# Patient Record
Sex: Female | Born: 1985 | Race: Black or African American | Hispanic: No | Marital: Single | Smoking: Current some day smoker
Health system: Southern US, Community
[De-identification: ages and names within clinical notes are randomized; demographics above are authoritative.]

## PROBLEM LIST (undated history)

## (undated) DIAGNOSIS — N939 Abnormal uterine and vaginal bleeding, unspecified: Secondary | ICD-10-CM

## (undated) DIAGNOSIS — E282 Polycystic ovarian syndrome: Secondary | ICD-10-CM

## (undated) DIAGNOSIS — D649 Anemia, unspecified: Secondary | ICD-10-CM

## (undated) DIAGNOSIS — IMO0002 Reserved for concepts with insufficient information to code with codable children: Secondary | ICD-10-CM

## (undated) DIAGNOSIS — A749 Chlamydial infection, unspecified: Secondary | ICD-10-CM

## (undated) DIAGNOSIS — R87629 Unspecified abnormal cytological findings in specimens from vagina: Secondary | ICD-10-CM

## (undated) DIAGNOSIS — I10 Essential (primary) hypertension: Secondary | ICD-10-CM

## (undated) HISTORY — DX: Anemia, unspecified: D64.9

## (undated) HISTORY — DX: Abnormal uterine and vaginal bleeding, unspecified: N93.9

## (undated) HISTORY — DX: Morbid (severe) obesity due to excess calories: E66.01

## (undated) HISTORY — DX: Polycystic ovarian syndrome: E28.2

## (undated) HISTORY — DX: Essential (primary) hypertension: I10

## (undated) HISTORY — DX: Reserved for concepts with insufficient information to code with codable children: IMO0002

## (undated) HISTORY — DX: Chlamydial infection, unspecified: A74.9

---
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
## 2007-03-03 ENCOUNTER — Emergency Department (HOSPITAL_COMMUNITY): Admission: EM | Admit: 2007-03-03 | Discharge: 2007-03-03 | Payer: Self-pay | Admitting: *Deleted

## 2008-01-16 ENCOUNTER — Emergency Department (HOSPITAL_COMMUNITY): Admission: EM | Admit: 2008-01-16 | Discharge: 2008-01-16 | Payer: Self-pay | Admitting: Emergency Medicine

## 2009-10-17 ENCOUNTER — Inpatient Hospital Stay (HOSPITAL_COMMUNITY): Admission: AD | Admit: 2009-10-17 | Discharge: 2009-10-17 | Payer: Self-pay | Admitting: Obstetrics & Gynecology

## 2009-12-29 ENCOUNTER — Inpatient Hospital Stay (HOSPITAL_COMMUNITY): Admission: AD | Admit: 2009-12-29 | Discharge: 2009-12-29 | Payer: Self-pay | Admitting: Family Medicine

## 2009-12-29 ENCOUNTER — Ambulatory Visit: Payer: Self-pay | Admitting: Gynecology

## 2010-03-07 ENCOUNTER — Inpatient Hospital Stay (HOSPITAL_COMMUNITY): Admission: AD | Admit: 2010-03-07 | Discharge: 2010-03-07 | Payer: Self-pay | Admitting: Obstetrics and Gynecology

## 2010-03-07 ENCOUNTER — Ambulatory Visit: Payer: Self-pay | Admitting: Family

## 2010-05-30 ENCOUNTER — Emergency Department (HOSPITAL_COMMUNITY)
Admission: EM | Admit: 2010-05-30 | Discharge: 2010-05-30 | Payer: Self-pay | Source: Home / Self Care | Admitting: Emergency Medicine

## 2010-06-22 ENCOUNTER — Ambulatory Visit: Payer: Self-pay | Admitting: Family Medicine

## 2010-06-22 DIAGNOSIS — IMO0002 Reserved for concepts with insufficient information to code with codable children: Secondary | ICD-10-CM

## 2010-06-22 DIAGNOSIS — R87619 Unspecified abnormal cytological findings in specimens from cervix uteri: Secondary | ICD-10-CM

## 2010-06-22 HISTORY — DX: Reserved for concepts with insufficient information to code with codable children: IMO0002

## 2010-06-22 HISTORY — DX: Unspecified abnormal cytological findings in specimens from cervix uteri: R87.619

## 2010-07-11 ENCOUNTER — Inpatient Hospital Stay (HOSPITAL_COMMUNITY)
Admission: AD | Admit: 2010-07-11 | Discharge: 2010-07-11 | Payer: Self-pay | Source: Home / Self Care | Attending: Family Medicine | Admitting: Family Medicine

## 2010-07-17 LAB — URINALYSIS, ROUTINE W REFLEX MICROSCOPIC
Bilirubin Urine: NEGATIVE
Ketones, ur: NEGATIVE mg/dL
Leukocytes, UA: NEGATIVE
Nitrite: NEGATIVE
Protein, ur: NEGATIVE mg/dL
Specific Gravity, Urine: 1.025 (ref 1.005–1.030)
Urine Glucose, Fasting: NEGATIVE mg/dL
Urobilinogen, UA: 0.2 mg/dL (ref 0.0–1.0)
pH: 6.5 (ref 5.0–8.0)

## 2010-07-17 LAB — POCT PREGNANCY, URINE: Preg Test, Ur: NEGATIVE

## 2010-07-17 LAB — URINE MICROSCOPIC-ADD ON

## 2010-07-19 ENCOUNTER — Inpatient Hospital Stay (HOSPITAL_COMMUNITY)
Admission: AD | Admit: 2010-07-19 | Discharge: 2010-07-19 | Payer: Self-pay | Source: Home / Self Care | Attending: Obstetrics and Gynecology | Admitting: Obstetrics and Gynecology

## 2010-07-24 LAB — CBC
HCT: 31.2 % — ABNORMAL LOW (ref 36.0–46.0)
Hemoglobin: 9.9 g/dL — ABNORMAL LOW (ref 12.0–15.0)
MCH: 21.9 pg — ABNORMAL LOW (ref 26.0–34.0)
MCHC: 31.7 g/dL (ref 30.0–36.0)
MCV: 69 fL — ABNORMAL LOW (ref 78.0–100.0)
Platelets: 420 10*3/uL — ABNORMAL HIGH (ref 150–400)
RBC: 4.52 MIL/uL (ref 3.87–5.11)
RDW: 16.9 % — ABNORMAL HIGH (ref 11.5–15.5)
WBC: 8.4 10*3/uL (ref 4.0–10.5)

## 2010-07-24 LAB — GC/CHLAMYDIA PROBE AMP, GENITAL
Chlamydia, DNA Probe: NEGATIVE
GC Probe Amp, Genital: NEGATIVE

## 2010-07-24 LAB — POCT PREGNANCY, URINE: Preg Test, Ur: NEGATIVE

## 2010-07-24 LAB — WET PREP, GENITAL
Trich, Wet Prep: NONE SEEN
Yeast Wet Prep HPF POC: NONE SEEN

## 2010-07-24 LAB — URINALYSIS, ROUTINE W REFLEX MICROSCOPIC
Bilirubin Urine: NEGATIVE
Ketones, ur: NEGATIVE mg/dL
Leukocytes, UA: NEGATIVE
Nitrite: NEGATIVE
Protein, ur: NEGATIVE mg/dL
Specific Gravity, Urine: 1.025 (ref 1.005–1.030)
Urine Glucose, Fasting: NEGATIVE mg/dL
Urobilinogen, UA: 0.2 mg/dL (ref 0.0–1.0)
pH: 6.5 (ref 5.0–8.0)

## 2010-07-24 LAB — URINE MICROSCOPIC-ADD ON

## 2010-07-26 ENCOUNTER — Ambulatory Visit
Admission: RE | Admit: 2010-07-26 | Discharge: 2010-07-26 | Payer: Self-pay | Source: Home / Self Care | Attending: Obstetrics and Gynecology | Admitting: Obstetrics and Gynecology

## 2010-07-26 LAB — POCT PREGNANCY, URINE: Preg Test, Ur: NEGATIVE

## 2010-07-28 ENCOUNTER — Ambulatory Visit
Admission: RE | Admit: 2010-07-28 | Discharge: 2010-07-28 | Payer: Self-pay | Source: Home / Self Care | Attending: Obstetrics & Gynecology | Admitting: Obstetrics & Gynecology

## 2010-07-28 ENCOUNTER — Other Ambulatory Visit: Payer: Self-pay | Admitting: Obstetrics and Gynecology

## 2010-07-28 ENCOUNTER — Other Ambulatory Visit
Admission: RE | Admit: 2010-07-28 | Discharge: 2010-07-28 | Payer: Self-pay | Source: Home / Self Care | Admitting: Obstetrics and Gynecology

## 2010-07-28 HISTORY — PX: COLPOSCOPY: SHX161

## 2010-07-28 LAB — POCT PREGNANCY, URINE: Preg Test, Ur: NEGATIVE

## 2010-08-10 ENCOUNTER — Ambulatory Visit (INDEPENDENT_AMBULATORY_CARE_PROVIDER_SITE_OTHER): Payer: Self-pay | Admitting: Physician Assistant

## 2010-08-10 DIAGNOSIS — R87610 Atypical squamous cells of undetermined significance on cytologic smear of cervix (ASC-US): Secondary | ICD-10-CM

## 2010-09-11 LAB — POCT PREGNANCY, URINE: Preg Test, Ur: NEGATIVE

## 2010-09-12 LAB — RAPID STREP SCREEN (MED CTR MEBANE ONLY): Streptococcus, Group A Screen (Direct): POSITIVE — AB

## 2010-09-14 LAB — URINALYSIS, ROUTINE W REFLEX MICROSCOPIC
Bilirubin Urine: NEGATIVE
Glucose, UA: NEGATIVE mg/dL
Ketones, ur: NEGATIVE mg/dL
Leukocytes, UA: NEGATIVE
Nitrite: NEGATIVE
Protein, ur: NEGATIVE mg/dL
Specific Gravity, Urine: 1.025 (ref 1.005–1.030)
Urobilinogen, UA: 1 mg/dL (ref 0.0–1.0)
pH: 6.5 (ref 5.0–8.0)

## 2010-09-14 LAB — CBC
HCT: 30.5 % — ABNORMAL LOW (ref 36.0–46.0)
Hemoglobin: 10.1 g/dL — ABNORMAL LOW (ref 12.0–15.0)
MCH: 24.9 pg — ABNORMAL LOW (ref 26.0–34.0)
MCHC: 33.1 g/dL (ref 30.0–36.0)
MCV: 75.2 fL — ABNORMAL LOW (ref 78.0–100.0)
Platelets: 318 10*3/uL (ref 150–400)
RBC: 4.06 MIL/uL (ref 3.87–5.11)
RDW: 16.3 % — ABNORMAL HIGH (ref 11.5–15.5)
WBC: 8.3 10*3/uL (ref 4.0–10.5)

## 2010-09-14 LAB — WET PREP, GENITAL
Clue Cells Wet Prep HPF POC: NONE SEEN
Yeast Wet Prep HPF POC: NONE SEEN

## 2010-09-14 LAB — POCT PREGNANCY, URINE: Preg Test, Ur: NEGATIVE

## 2010-09-14 LAB — GC/CHLAMYDIA PROBE AMP, GENITAL: GC Probe Amp, Genital: NEGATIVE

## 2010-09-14 LAB — URINE MICROSCOPIC-ADD ON

## 2010-09-17 LAB — URINALYSIS, ROUTINE W REFLEX MICROSCOPIC
Bilirubin Urine: NEGATIVE
Glucose, UA: NEGATIVE mg/dL
Ketones, ur: NEGATIVE mg/dL
Leukocytes, UA: NEGATIVE
Nitrite: NEGATIVE
Protein, ur: NEGATIVE mg/dL
Specific Gravity, Urine: >1.03 — ABNORMAL HIGH (ref 1.005–1.030)
Urobilinogen, UA: 0.2 mg/dL (ref 0.0–1.0)
pH: 6 (ref 5.0–8.0)

## 2010-09-17 LAB — CBC
HCT: 34.2 % — ABNORMAL LOW (ref 36.0–46.0)
Hemoglobin: 11.2 g/dL — ABNORMAL LOW (ref 12.0–15.0)
MCH: 25 pg — ABNORMAL LOW (ref 26.0–34.0)
MCHC: 32.9 g/dL (ref 30.0–36.0)
MCV: 75.9 fL — ABNORMAL LOW (ref 78.0–100.0)
Platelets: 294 10*3/uL (ref 150–400)
RBC: 4.5 MIL/uL (ref 3.87–5.11)
RDW: 15.8 % — ABNORMAL HIGH (ref 11.5–15.5)
WBC: 6.8 10*3/uL (ref 4.0–10.5)

## 2010-09-17 LAB — POCT PREGNANCY, URINE: Preg Test, Ur: NEGATIVE

## 2010-09-17 LAB — WET PREP, GENITAL: Clue Cells Wet Prep HPF POC: NONE SEEN

## 2010-09-17 LAB — GC/CHLAMYDIA PROBE AMP, GENITAL
Chlamydia, DNA Probe: NEGATIVE
GC Probe Amp, Genital: NEGATIVE

## 2010-09-19 LAB — WET PREP, GENITAL: Yeast Wet Prep HPF POC: NONE SEEN

## 2010-09-19 LAB — CBC
HCT: 34.7 % — ABNORMAL LOW (ref 36.0–46.0)
Platelets: 375 10*3/uL (ref 150–400)
RBC: 4.53 MIL/uL (ref 3.87–5.11)
WBC: 8.4 10*3/uL (ref 4.0–10.5)

## 2010-09-22 NOTE — Progress Notes (Signed)
NAME:  Carolyn Cortez, Carolyn Cortez NO.:  [id number]  MEDICAL RECORD NO.:  [id number]           PATIENT TYPE:  A  LOCATION:  [facility]                   FACILITY:  [facility]  PHYSICIAN:  Maylon Cos, CNM    DATE OF BIRTH:  [birth year]  DATE OF SERVICE:  08/10/2010                                 CLINIC NOTE  The patient is being seen in GYN Clinic at [facility].  Reason for today's visit is results of her colpo.  HISTORY OF PRESENT ILLNESS:  The patient is a 25 year old nulligravida patient who had an abnormal Pap smear in December showing ASCUS with positive HPV.  She underwent a colposcopy on July 28, 2010, presents today for the results.  The results were shared with the patient as follows:  Benign endocervical gland on the endocervix curettage.  The cervix biopsy shows a transitional zone mucosa with associated chronic inflammation, no dysplasia or malignancy.  These results were shared with the patient and her mother is also present during the visit.  The patient states she has chronic bacterial vaginosis and that could be a cause of her chronic inflammation seen on the cervical biopsy, and recommendation is to re-Pap q.6 month.  The patient verbalizes understanding and agreement with plan.  The patient also requests refill on her Sprintec oral contraceptive pills.  ASSESSMENT: 1. 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 2. Contraceptive management.  PLAN: 1. The patient is to return in 6 months for repeat Pap. 2. Sprintec prescriptions given with 12 refills.  The patient should     follow up as scheduled in 6 months or p.r.n. problems.          ______________________________ Maylon Cos, CNM    SS/MEDQ  D:  08/10/2010  T:  08/11/2010  Job:  [id number]

## 2010-10-21 ENCOUNTER — Inpatient Hospital Stay (HOSPITAL_COMMUNITY)
Admission: AD | Admit: 2010-10-21 | Discharge: 2010-10-22 | Discharge status: Against Medical Advice | Payer: Self-pay | Source: Ambulatory Visit | Attending: Obstetrics & Gynecology | Admitting: Obstetrics & Gynecology

## 2010-10-21 DIAGNOSIS — N912 Amenorrhea, unspecified: Secondary | ICD-10-CM | POA: Insufficient documentation

## 2010-10-21 DIAGNOSIS — Z3202 Encounter for pregnancy test, result negative: Secondary | ICD-10-CM | POA: Insufficient documentation

## 2010-12-28 ENCOUNTER — Ambulatory Visit: Payer: Self-pay | Admitting: Physician Assistant

## 2011-04-09 ENCOUNTER — Encounter: Payer: Self-pay | Admitting: *Deleted

## 2011-04-09 DIAGNOSIS — E282 Polycystic ovarian syndrome: Secondary | ICD-10-CM | POA: Insufficient documentation

## 2011-04-09 DIAGNOSIS — Z8619 Personal history of other infectious and parasitic diseases: Secondary | ICD-10-CM | POA: Insufficient documentation

## 2011-04-11 ENCOUNTER — Ambulatory Visit: Payer: Self-pay | Admitting: Obstetrics and Gynecology

## 2011-04-18 LAB — RAPID STREP SCREEN (MED CTR MEBANE ONLY): Streptococcus, Group A Screen (Direct): NEGATIVE

## 2011-04-27 ENCOUNTER — Encounter: Payer: Self-pay | Admitting: Obstetrics and Gynecology

## 2011-04-27 ENCOUNTER — Ambulatory Visit (INDEPENDENT_AMBULATORY_CARE_PROVIDER_SITE_OTHER): Payer: Self-pay | Admitting: Obstetrics and Gynecology

## 2011-04-27 VITALS — BP 117/77 | HR 84 | Temp 98.1°F | Ht 61.0 in | Wt 213.2 lb

## 2011-04-27 DIAGNOSIS — N949 Unspecified condition associated with female genital organs and menstrual cycle: Secondary | ICD-10-CM

## 2011-04-27 DIAGNOSIS — R87619 Unspecified abnormal cytological findings in specimens from cervix uteri: Secondary | ICD-10-CM

## 2011-04-27 DIAGNOSIS — N938 Other specified abnormal uterine and vaginal bleeding: Secondary | ICD-10-CM | POA: Insufficient documentation

## 2011-04-27 DIAGNOSIS — E282 Polycystic ovarian syndrome: Secondary | ICD-10-CM

## 2011-04-27 MED ORDER — METFORMIN HCL 500 MG PO TABS: 500.0000 mg | ORAL_TABLET | Freq: Two times a day (BID) | ORAL | Status: DC

## 2011-04-27 MED ORDER — NORGESTIMATE-ETH ESTRADIOL 0.25-35 MG-MCG PO TABS: 1.0000 | ORAL_TABLET | Freq: Every day | ORAL | Status: DC

## 2011-04-27 NOTE — Progress Notes (Signed)
Patient is a 25 year old African American female nulligravida with polycystic ovarian syndrome she was placed on oral contraceptives but 6 months ago which controlled her bleeding nicely however they had very expensive for her because of the brain and she was and she stopped them in June began bleeding in August and has been spotting or bleeding almost every day since. She also thought that caused her to put a lot of weight. We discussed with her polycystic ovarian syndrome and insulin insensitivity total weight gain was probably not from the pill but from and insulin insensitivity so we'll going to put her back on Sprintec and start metformin 500 twice a day I've told her 1 she's been on his metformin twice a day with meals for several months she might try coming off of the Sprintec if she desired and see whether or not this is truly the cause of her weight gain. She seems okay with this plan so we will proceed.  Repeat Pap smear was taken. Previous Pap smear with ascus plus high-risk HPV. Colposcopy was benign. This is her first Pap smear post a colposcopy.  Impression: Polycystic ovarian syndrome with dysfunctional uterine bleeding and weight gain.

## 2011-08-07 ENCOUNTER — Encounter (HOSPITAL_COMMUNITY): Payer: Self-pay | Admitting: *Deleted

## 2011-08-07 ENCOUNTER — Emergency Department (HOSPITAL_COMMUNITY): Payer: Self-pay

## 2011-08-07 ENCOUNTER — Emergency Department (HOSPITAL_COMMUNITY)
Admission: EM | Admit: 2011-08-07 | Discharge: 2011-08-07 | Disposition: A | Discharge status: Home / Self Care | Payer: Self-pay | Attending: Emergency Medicine | Admitting: Emergency Medicine

## 2011-08-07 DIAGNOSIS — R51 Headache: Secondary | ICD-10-CM | POA: Insufficient documentation

## 2011-08-07 DIAGNOSIS — R6884 Jaw pain: Secondary | ICD-10-CM | POA: Insufficient documentation

## 2011-08-07 DIAGNOSIS — S0083XA Contusion of other part of head, initial encounter: Secondary | ICD-10-CM

## 2011-08-07 DIAGNOSIS — R220 Localized swelling, mass and lump, head: Secondary | ICD-10-CM | POA: Insufficient documentation

## 2011-08-07 DIAGNOSIS — S0003XA Contusion of scalp, initial encounter: Secondary | ICD-10-CM | POA: Insufficient documentation

## 2011-08-07 DIAGNOSIS — R221 Localized swelling, mass and lump, neck: Secondary | ICD-10-CM | POA: Insufficient documentation

## 2011-08-07 MED ORDER — IBUPROFEN 800 MG PO TABS
800.0000 mg | ORAL_TABLET | Freq: Once | ORAL | Status: AC
  Administered 2011-08-07: 800 mg via ORAL
  Filled 2011-08-07: qty 1
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
## 2011-08-07 NOTE — ED Notes (Signed)
Pt is here with headache to left side and woke up with it.  No fever or vision change.  Pt sts she has a swollen knot to left side of head.

## 2011-08-07 NOTE — ED Provider Notes (Signed)
Medical screening examination/treatment/procedure(s) were performed by non-physician practitioner and as supervising physician I was immediately available for consultation/collaboration.   Forbes Cellar, MD 08/07/11 1158

## 2011-08-15 ENCOUNTER — Ambulatory Visit: Payer: Self-pay | Admitting: Physician Assistant

## 2011-09-13 ENCOUNTER — Ambulatory Visit: Payer: Self-pay | Admitting: Physician Assistant

## 2012-05-14 ENCOUNTER — Encounter (HOSPITAL_COMMUNITY): Payer: Self-pay | Admitting: *Deleted

## 2012-05-14 ENCOUNTER — Inpatient Hospital Stay (HOSPITAL_COMMUNITY)
Admission: AD | Admit: 2012-05-14 | Discharge: 2012-05-14 | Disposition: A | Discharge status: Home / Self Care | Payer: Self-pay | Source: Ambulatory Visit | Attending: Obstetrics & Gynecology | Admitting: Obstetrics & Gynecology

## 2012-05-14 DIAGNOSIS — N938 Other specified abnormal uterine and vaginal bleeding: Secondary | ICD-10-CM | POA: Insufficient documentation

## 2012-05-14 DIAGNOSIS — E282 Polycystic ovarian syndrome: Secondary | ICD-10-CM

## 2012-05-14 DIAGNOSIS — Z202 Contact with and (suspected) exposure to infections with a predominantly sexual mode of transmission: Secondary | ICD-10-CM | POA: Insufficient documentation

## 2012-05-14 DIAGNOSIS — N920 Excessive and frequent menstruation with regular cycle: Secondary | ICD-10-CM | POA: Insufficient documentation

## 2012-05-14 DIAGNOSIS — N949 Unspecified condition associated with female genital organs and menstrual cycle: Secondary | ICD-10-CM | POA: Insufficient documentation

## 2012-05-14 LAB — URINALYSIS, ROUTINE W REFLEX MICROSCOPIC
Ketones, ur: NEGATIVE mg/dL
Leukocytes, UA: NEGATIVE
Nitrite: NEGATIVE
Protein, ur: NEGATIVE mg/dL
pH: 7 (ref 5.0–8.0)

## 2012-05-14 LAB — WET PREP, GENITAL

## 2012-05-14 LAB — URINE MICROSCOPIC-ADD ON

## 2012-05-14 LAB — CBC
HCT: 31.5 % — ABNORMAL LOW (ref 36.0–46.0)
MCV: 67.3 fL — ABNORMAL LOW (ref 78.0–100.0)
RBC: 4.68 MIL/uL (ref 3.87–5.11)
WBC: 8.3 10*3/uL (ref 4.0–10.5)

## 2012-05-14 MED ORDER — METFORMIN HCL 500 MG PO TABS: 500.0000 mg | ORAL_TABLET | Freq: Two times a day (BID) | ORAL | Status: DC

## 2012-05-14 MED ORDER — NORGESTIMATE-ETH ESTRADIOL 0.25-35 MG-MCG PO TABS: 1.0000 | ORAL_TABLET | Freq: Every day | ORAL | Status: DC

## 2012-05-14 NOTE — MAU Provider Note (Signed)
History     CSN: [id number]  Arrival date and time: 05/14/12 4098   First Provider Initiated Contact with Patient 05/14/12 1949      Chief Complaint  Patient presents with  . Vaginal Bleeding   HPI This is a 26 y.o. female who presents with complaints of heavy vaginal bleeding. Started Sunday.  Some cramps. Was recently told by boyfriend that he has trich.   She was seen previously by Dr Okey Dupre who diagnosed her with PCOS and put her on OCPs and metformin. She since then has stopped both, thinking they caused her to gain weight.   RN Note: Heavy bleeding since Sunday LMP 10/30. No pain   OB History    Grav Para Term Preterm Abortions TAB SAB Ect Mult Living   0 0 0 0 0 0 0 0 0 0       Past Medical History  Diagnosis Date  . PCOS (polycystic ovarian syndrome)   . Abnormal Pap smear 06/22/2010  . Chlamydia   . Anemia   . Vaginal bleeding, abnormal     Past Surgical History  Procedure Date  . Colposcopy 07/28/2010    History reviewed. No pertinent family history.  History  Substance Use Topics  . Smoking status: Former Games developer  . Smokeless tobacco: Never Used  . Alcohol Use: 1.0 oz/week    2 drink(s) per week    Allergies: No Known Allergies  Prescriptions prior to admission  Medication Sig Dispense Refill  . Aspirin-Acetaminophen-Caffeine (EXCEDRIN PO) Take 1 tablet by mouth every 6 (six) hours as needed. For pain      . Ferrous Sulfate (SLOW FE PO) Take 1 tablet by mouth daily.       Marland Kitchen ibuprofen (ADVIL,MOTRIN) 200 MG tablet Take 400 mg by mouth every 6 (six) hours as needed. For pain        ROS See HPI  Physical Exam   Temperature 98.2 F (36.8 C), temperature source Oral, resp. rate 18, height 5\' 2"  (1.575 m), weight 204 lb (92.534 kg), last menstrual period 04/30/2012.  Physical Exam  Constitutional: She is oriented to person, place, and time. She appears well-developed and well-nourished. No distress.  Cardiovascular: Normal rate.   Respiratory:  Effort normal.  GI: Soft. She exhibits no distension and no mass. There is no tenderness. There is no rebound and no guarding.  Genitourinary: Uterus normal. Vaginal discharge (Moderate blood in vault.  Moderate CMT. Difficult to tell if it was true CMT or anxiety with exam) found.  Musculoskeletal: Normal range of motion.  Neurological: She is alert and oriented to person, place, and time.  Skin: Skin is warm and dry.  Psychiatric: She has a normal mood and affect.   Results for orders placed during the hospital encounter of 05/14/12 (from the past 24 hour(s))  URINALYSIS, ROUTINE W REFLEX MICROSCOPIC     Status: Abnormal   Collection Time   05/14/12  6:50 PM      Component Value Range   Color, Urine YELLOW  YELLOW   APPearance CLEAR  CLEAR   Specific Gravity, Urine 1.015  1.005 - 1.030   pH 7.0  5.0 - 8.0   Glucose, UA NEGATIVE  NEGATIVE mg/dL   Hgb urine dipstick LARGE (*) NEGATIVE   Bilirubin Urine NEGATIVE  NEGATIVE   Ketones, ur NEGATIVE  NEGATIVE mg/dL   Protein, ur NEGATIVE  NEGATIVE mg/dL   Urobilinogen, UA 1.0  0.0 - 1.0 mg/dL   Nitrite NEGATIVE  NEGATIVE   Leukocytes,  UA NEGATIVE  NEGATIVE  URINE MICROSCOPIC-ADD ON     Status: Abnormal   Collection Time   05/14/12  6:50 PM      Component Value Range   Squamous Epithelial / LPF FEW (*) RARE   WBC, UA 0-2  <3 WBC/hpf   RBC / HPF 21-50  <3 RBC/hpf   Bacteria, UA MANY (*) RARE   Urine-Other MUCOUS PRESENT    POCT PREGNANCY, URINE     Status: Normal   Collection Time   05/14/12  7:14 PM      Component Value Range   Preg Test, Ur NEGATIVE  NEGATIVE  WET PREP, GENITAL     Status: Abnormal   Collection Time   05/14/12  7:57 PM      Component Value Range   Yeast Wet Prep HPF POC NONE SEEN  NONE SEEN   Trich, Wet Prep NONE SEEN  NONE SEEN   Clue Cells Wet Prep HPF POC FEW (*) NONE SEEN   WBC, Wet Prep HPF POC FEW (*) NONE SEEN  CBC     Status: Abnormal   Collection Time   05/14/12  8:00 PM      Component Value Range    WBC 8.3  4.0 - 10.5 K/uL   RBC 4.68  3.87 - 5.11 MIL/uL   Hemoglobin 10.2 (*) 12.0 - 15.0 g/dL   HCT 16.1 (*) 09.6 - 04.5 %   MCV 67.3 (*) 78.0 - 100.0 fL   MCH 21.8 (*) 26.0 - 34.0 pg   MCHC 32.4  30.0 - 36.0 g/dL   RDW 40.9 (*) 81.1 - 91.4 %   Platelets 361  150 - 400 K/uL    MAU Course  Procedures  Assessment and Plan  A:  Menorrhagia/DUB      PCOS      Exposure to Trich, wet prep positive for clue  P:  Will have her resume OCPs and Metformin       Will Rx Flagyl for BV       Encouraged her to seek Family doctor to eval for diabetes  [facility] 05/14/2012, 8:41 PM

## 2012-05-14 NOTE — MAU Note (Signed)
Heavy bleeding since Sunday LMP 10/30. No pain

## 2012-05-15 LAB — GC/CHLAMYDIA PROBE AMP, GENITAL
Chlamydia, DNA Probe: NEGATIVE
GC Probe Amp, Genital: NEGATIVE

## 2012-05-21 ENCOUNTER — Telehealth: Payer: Self-pay

## 2012-05-21 NOTE — Telephone Encounter (Signed)
Pt called and stated I have a question but didn't say what they question was. Called pt and pt asked what to do cause she usually takes her BCP @ 630 am every day and today she forgot to take it @ 630 am.  I advised pt to go ahead and take the pill now and just continue with her normal routine.  Pt asked is that "ok".  I stated to pt that yes it's ok but to make sure that she continues to take BCP same time everyday and because she missed a time just take the pill as soon as you remember but to please not make a habit of doing so.  Pt stated understanding and no further questions.

## 2012-05-26 ENCOUNTER — Ambulatory Visit: Payer: Self-pay | Admitting: Obstetrics and Gynecology

## 2012-07-16 ENCOUNTER — Ambulatory Visit: Payer: Self-pay | Admitting: Family Medicine

## 2012-07-17 ENCOUNTER — Encounter (HOSPITAL_COMMUNITY): Payer: Self-pay | Admitting: Emergency Medicine

## 2012-07-17 ENCOUNTER — Other Ambulatory Visit: Payer: Self-pay

## 2012-07-17 ENCOUNTER — Emergency Department (HOSPITAL_COMMUNITY)
Admission: EM | Admit: 2012-07-17 | Discharge: 2012-07-17 | Disposition: A | Discharge status: Home / Self Care | Payer: Self-pay | Attending: Emergency Medicine | Admitting: Emergency Medicine

## 2012-07-17 ENCOUNTER — Emergency Department (HOSPITAL_COMMUNITY): Payer: Self-pay

## 2012-07-17 DIAGNOSIS — Z862 Personal history of diseases of the blood and blood-forming organs and certain disorders involving the immune mechanism: Secondary | ICD-10-CM | POA: Insufficient documentation

## 2012-07-17 DIAGNOSIS — R51 Headache: Secondary | ICD-10-CM | POA: Insufficient documentation

## 2012-07-17 DIAGNOSIS — R1110 Vomiting, unspecified: Secondary | ICD-10-CM | POA: Insufficient documentation

## 2012-07-17 DIAGNOSIS — Z3202 Encounter for pregnancy test, result negative: Secondary | ICD-10-CM | POA: Insufficient documentation

## 2012-07-17 DIAGNOSIS — Z8619 Personal history of other infectious and parasitic diseases: Secondary | ICD-10-CM | POA: Insufficient documentation

## 2012-07-17 DIAGNOSIS — Z8742 Personal history of other diseases of the female genital tract: Secondary | ICD-10-CM | POA: Insufficient documentation

## 2012-07-17 DIAGNOSIS — Z87891 Personal history of nicotine dependence: Secondary | ICD-10-CM | POA: Insufficient documentation

## 2012-07-17 LAB — BASIC METABOLIC PANEL
BUN: 10 mg/dL (ref 6–23)
CO2: 24 mEq/L (ref 19–32)
Calcium: 9.1 mg/dL (ref 8.4–10.5)
Chloride: 109 mEq/L (ref 96–112)
Creatinine, Ser: 0.65 mg/dL (ref 0.50–1.10)
GFR calc Af Amer: >90 mL/min (ref >90)
GFR calc non Af Amer: >90 mL/min (ref >90)
Glucose, Bld: 90 mg/dL (ref 70–99)
Potassium: 4.4 mEq/L (ref 3.5–5.1)
Sodium: 143 mEq/L (ref 135–145)

## 2012-07-17 LAB — CBC WITH DIFFERENTIAL/PLATELET
Basophils Absolute: 0 10*3/uL (ref 0.0–0.1)
Basophils Relative: 0 % (ref 0–1)
Eosinophils Absolute: 0.1 10*3/uL (ref 0.0–0.7)
Eosinophils Relative: 1 % (ref 0–5)
HCT: 32.8 % — ABNORMAL LOW (ref 36.0–46.0)
Hemoglobin: 10.9 g/dL — ABNORMAL LOW (ref 12.0–15.0)
Lymphocytes Relative: 29 % (ref 12–46)
Lymphs Abs: 2 10*3/uL (ref 0.7–4.0)
MCH: 22.9 pg — ABNORMAL LOW (ref 26.0–34.0)
MCHC: 33.2 g/dL (ref 30.0–36.0)
MCV: 69.1 fL — ABNORMAL LOW (ref 78.0–100.0)
Monocytes Absolute: 0.7 10*3/uL (ref 0.1–1.0)
Monocytes Relative: 10 % (ref 3–12)
Neutro Abs: 4 10*3/uL (ref 1.7–7.7)
Neutrophils Relative %: 60 % (ref 43–77)
Platelets: 396 10*3/uL (ref 150–400)
RBC: 4.75 MIL/uL (ref 3.87–5.11)
RDW: 17.4 % — ABNORMAL HIGH (ref 11.5–15.5)
WBC: 6.8 10*3/uL (ref 4.0–10.5)

## 2012-07-17 LAB — URINALYSIS, ROUTINE W REFLEX MICROSCOPIC
Glucose, UA: NEGATIVE mg/dL
Ketones, ur: NEGATIVE mg/dL
Leukocytes, UA: NEGATIVE
pH: 6 (ref 5.0–8.0)

## 2012-07-17 LAB — POCT PREGNANCY, URINE: Preg Test, Ur: NEGATIVE

## 2012-07-17 MED ORDER — KETOROLAC TROMETHAMINE 30 MG/ML IJ SOLN
30.0000 mg | Freq: Once | INTRAMUSCULAR | Status: DC
  Filled 2012-07-17: qty 1

## 2012-07-17 MED ORDER — IBUPROFEN 800 MG PO TABS: 800.0000 mg | ORAL_TABLET | Freq: Three times a day (TID) | ORAL | Status: DC | PRN

## 2012-07-17 MED ORDER — PROMETHAZINE HCL 25 MG PO TABS: 25.0000 mg | ORAL_TABLET | Freq: Four times a day (QID) | ORAL | Status: DC | PRN

## 2012-07-17 MED ORDER — ONDANSETRON HCL 4 MG/2ML IJ SOLN
4.0000 mg | Freq: Once | INTRAMUSCULAR | Status: DC
  Filled 2012-07-17: qty 2

## 2012-07-17 MED ORDER — IBUPROFEN 800 MG PO TABS
800.0000 mg | ORAL_TABLET | Freq: Once | ORAL | Status: AC
  Administered 2012-07-17: 800 mg via ORAL
  Filled 2012-07-17: qty 1

## 2012-07-17 MED ORDER — SODIUM CHLORIDE 0.9 % IV BOLUS (SEPSIS): 1000.0000 mL | Freq: Once | INTRAVENOUS | Status: DC

## 2012-07-17 NOTE — ED Notes (Signed)
Rt sided cp that started today and had some sob x 2 months when she talks  Vomited x 3 this am lmp  End of dec

## 2012-07-17 NOTE — ED Notes (Signed)
Pt refused IV, IV fluids, and IV meds. Pt requests po meds.

## 2012-07-17 NOTE — ED Provider Notes (Signed)
Medical screening examination/treatment/procedure(s) were performed by non-physician practitioner and as supervising physician I was immediately available for consultation/collaboration.  Doug Sou, MD 07/17/12 [phone]

## 2012-07-17 NOTE — ED Provider Notes (Signed)
History     CSN: [id number]  Arrival date & time 07/17/12  6213   First MD Initiated Contact with Patient 07/17/12 0932      No chief complaint on file.   (Consider location/radiation/quality/duration/timing/severity/associated sxs/prior treatment) HPI Patient presents to the emergency department with vomiting and headache.  Patient states she had vomiting x5 this morning and then noted headache and right upper chest pain after this.  Patient denies abdominal pain, vaginal bleeding, vaginal discharge, blurred vision, syncope, dizziness, weakness, back pain, dysuria, or fever. The patient does not smoke. The patient states that palpation make the chest pain worse. The patient did not take anything prior to arrival. Past Medical History  Diagnosis Date  . PCOS (polycystic ovarian syndrome)   . Abnormal Pap smear 06/22/2010  . Chlamydia   . Anemia   . Vaginal bleeding, abnormal     Past Surgical History  Procedure Date  . Colposcopy 07/28/2010    No family history on file.  History  Substance Use Topics  . Smoking status: Former Games developer  . Smokeless tobacco: Never Used  . Alcohol Use: 1.0 oz/week    2 drink(s) per week    OB History    Grav Para Term Preterm Abortions TAB SAB Ect Mult Living   0 0 0 0 0 0 0 0 0 0       Review of Systems All other systems negative except as documented in the HPI. All pertinent positives and negatives as reviewed in the HPI.  Allergies  Review of patient's allergies indicates no known allergies.  Home Medications   Current Outpatient Rx  Name  Route  Sig  Dispense  Refill  . SLOW FE PO   Oral   Take 1 tablet by mouth daily.          Marland Kitchen METFORMIN HCL 500 MG PO TABS   Oral   Take 1 tablet (500 mg total) by mouth 2 (two) times daily with a meal.   60 tablet   1   . NORGESTIMATE-ETH ESTRADIOL 0.25-35 MG-MCG PO TABS   Oral   Take 1 tablet by mouth daily.   1 Package   11     BP 135/80  Pulse 82  Temp 98.9 F (37.2 C)   Resp 16  SpO2 98%  LMP 07/02/2012  Physical Exam  Nursing note and vitals reviewed. Constitutional: She is oriented to person, place, and time. She appears well-developed and well-nourished.  HENT:  Head: Normocephalic and atraumatic.  Mouth/Throat: Oropharynx is clear and moist.  Eyes: Pupils are equal, round, and reactive to light.  Neck: Normal range of motion. Neck supple.  Cardiovascular: Normal rate, regular rhythm and normal heart sounds.  Exam reveals no gallop and no friction rub.   No murmur heard. Pulmonary/Chest: Effort normal and breath sounds normal. She exhibits tenderness.  Abdominal: Soft. Bowel sounds are normal.  Lymphadenopathy:    She has no cervical adenopathy.  Neurological: She is alert and oriented to person, place, and time. No cranial nerve deficit. She exhibits normal muscle tone. Coordination normal.  Skin: Skin is warm and dry. No rash noted.    ED Course  Procedures (including critical care time)  Labs Reviewed  URINALYSIS, ROUTINE W REFLEX MICROSCOPIC - Abnormal; Notable for the following:    Specific Gravity, Urine 1.033 (*)     All other components within normal limits  CBC WITH DIFFERENTIAL - Abnormal; Notable for the following:    Hemoglobin 10.9 (*)  HCT 32.8 (*)     MCV 69.1 (*)     MCH 22.9 (*)     RDW 17.4 (*)     All other components within normal limits  POCT PREGNANCY, URINE  BASIC METABOLIC PANEL   Dg Chest 2 View  07/17/2012  *RADIOLOGY REPORT*  Clinical Data: Chest pain  CHEST - 2 VIEW  Comparison: December 28, 2006  Findings: Lungs clear.  The heart size and pulmonary vascularity are normal.  No adenopathy.  No pneumothorax.  No bone lesions.  IMPRESSION: No abnormality noted.   Original Report Authenticated By: Bretta Bang, M.D.      Patient's chest pain is less likely due to pulmonary embolus based on the fact that the patient is low risk based on Wells Criteria .  Patient does not have any significant cardiac risk  factors.  This chest pain as right-sided and is palpable.  Patient has a negative chest x-ray.  Patient's EKG that showed acute changes.  Patient refuses IV fluids and pain medication for her headache.  Patient is given Motrin 800 mg with relief of the headache.  Patient is advised followup with her primary care doctor and told to return to the emergency department for any worsening in her condition.The patient was feeling complete relief with the motrin 800mg .    MDM  MDM Reviewed: nursing note and vitals Interpretation: labs and x-ray            Carlyle Dolly, PA-C 07/17/12 1134  Carlyle Dolly, PA-C 07/18/12 [phone]

## 2012-07-20 NOTE — ED Provider Notes (Signed)
Medical screening examination/treatment/procedure(s) were performed by non-physician practitioner and as supervising physician I was immediately available for consultation/collaboration.  Abbagale Goguen, MD 07/20/12 0543 

## 2012-07-30 ENCOUNTER — Ambulatory Visit: Payer: Self-pay | Admitting: Family Medicine

## 2013-01-08 ENCOUNTER — Emergency Department (HOSPITAL_COMMUNITY)
Admission: EM | Admit: 2013-01-08 | Discharge: 2013-01-08 | Disposition: A | Discharge status: Home / Self Care | Payer: Self-pay | Attending: Emergency Medicine | Admitting: Emergency Medicine

## 2013-01-08 ENCOUNTER — Encounter (HOSPITAL_COMMUNITY): Payer: Self-pay

## 2013-01-08 DIAGNOSIS — R51 Headache: Secondary | ICD-10-CM | POA: Insufficient documentation

## 2013-01-08 DIAGNOSIS — D649 Anemia, unspecified: Secondary | ICD-10-CM | POA: Insufficient documentation

## 2013-01-08 DIAGNOSIS — Z8619 Personal history of other infectious and parasitic diseases: Secondary | ICD-10-CM | POA: Insufficient documentation

## 2013-01-08 DIAGNOSIS — Z8742 Personal history of other diseases of the female genital tract: Secondary | ICD-10-CM | POA: Insufficient documentation

## 2013-01-08 DIAGNOSIS — Z79899 Other long term (current) drug therapy: Secondary | ICD-10-CM | POA: Insufficient documentation

## 2013-01-08 DIAGNOSIS — Z87891 Personal history of nicotine dependence: Secondary | ICD-10-CM | POA: Insufficient documentation

## 2013-01-08 DIAGNOSIS — H53149 Visual discomfort, unspecified: Secondary | ICD-10-CM | POA: Insufficient documentation

## 2013-01-08 DIAGNOSIS — R112 Nausea with vomiting, unspecified: Secondary | ICD-10-CM | POA: Insufficient documentation

## 2013-01-08 MED ORDER — DIPHENHYDRAMINE HCL 25 MG PO CAPS
25.0000 mg | ORAL_CAPSULE | Freq: Once | ORAL | Status: AC
  Administered 2013-01-08: 25 mg via ORAL
  Filled 2013-01-08: qty 1

## 2013-01-08 MED ORDER — DEXAMETHASONE 6 MG PO TABS
10.0000 mg | ORAL_TABLET | Freq: Once | ORAL | Status: AC
  Administered 2013-01-08: 10 mg via ORAL
  Filled 2013-01-08: qty 1

## 2013-01-08 MED ORDER — PROCHLORPERAZINE MALEATE 10 MG PO TABS
10.0000 mg | ORAL_TABLET | Freq: Once | ORAL | Status: AC
  Administered 2013-01-08: 10 mg via ORAL
  Filled 2013-01-08: qty 1

## 2013-01-08 NOTE — ED Provider Notes (Signed)
I saw and evaluated the patient, reviewed the resident's note and I agree with the findings and plan. If applicable, I agree with the resident's interpretation of the EKG.  If applicable, I was present for critical portions of any procedures performed.  Gradual onset headache, similar to previous, associated with tearing and photophobia.  No fever,  Appears well. CN 2-12 intact, no ataxia on finger to nose, no nystagmus, 5/5 strength throughout, no pronator drift, Romberg negative, normal gait.   Glynn Octave, MD 01/08/13 [phone]

## 2013-01-08 NOTE — ED Provider Notes (Signed)
History    CSN: [id number] Arrival date & time 01/08/13  1248  First MD Initiated Contact with Patient 01/08/13 1302     Chief Complaint  Patient presents with  . Headache   (Consider location/radiation/quality/duration/timing/severity/associated sxs/prior Treatment) HPI Comments: Pt w/ hx of frequent HAs now w/ HA. 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 Admits to 2 HAs/Wk. HA today is similar to prior. Woke up w/ frontal, aching pain, gradual onset, currently 6/10, a/w photophobia and N/V. Denies sudden on set, maximal at onset or worst of life. Denies unilateral numbness/weakness. No diplopia or blurred vision. No recent trauma, no fever. No confusion.   Patient is a 27 y.o. female presenting with general illness. The history is provided by the patient. No language interpreter was used.  Illness Location:  Cns Quality:  Headache Severity:  Moderate Onset quality:  Gradual Timing:  Constant Progression:  Unchanged Chronicity:  Recurrent Associated symptoms: headaches, nausea and vomiting   Associated symptoms: no abdominal pain, no chest pain, no congestion, no cough, no diarrhea, no fever, no rash, no shortness of breath and no sore throat    Past Medical History  Diagnosis Date  . PCOS (polycystic ovarian syndrome)   . Abnormal Pap smear 06/22/2010  . Chlamydia   . Anemia   . Vaginal bleeding, abnormal    Past Surgical History  Procedure Laterality Date  . Colposcopy  07/28/2010   History reviewed. No pertinent family history. History  Substance Use Topics  . Smoking status: Former Games developer  . Smokeless tobacco: Never Used  . Alcohol Use: 1.0 oz/week    2 drink(s) per week   OB History   Grav Para Term Preterm Abortions TAB SAB Ect Mult Living   0 0 0 0 0 0 0 0 0 0      Review of Systems  Constitutional: Negative for fever and chills.  HENT: Negative for congestion and sore throat.   Eyes: Positive for  photophobia.  Respiratory: Negative for cough and shortness of breath.   Cardiovascular: Negative for chest pain and leg swelling.  Gastrointestinal: Positive for nausea and vomiting. Negative for abdominal pain, diarrhea and constipation.  Genitourinary: Negative for dysuria and frequency.  Skin: Negative for color change and rash.  Neurological: Positive for headaches. Negative for dizziness.  Psychiatric/Behavioral: Negative for confusion and agitation.  All other systems reviewed and are negative.    Allergies  Review of patient's allergies indicates no known allergies.  Home Medications   Current Outpatient Rx  Name  Route  Sig  Dispense  Refill  . Ferrous Sulfate (SLOW FE PO)   Oral   Take 1 tablet by mouth daily.          Marland Kitchen ibuprofen (ADVIL,MOTRIN) 800 MG tablet   Oral   Take 1 tablet (800 mg total) by mouth every 8 (eight) hours as needed for pain.   21 tablet   0   . metFORMIN (GLUCOPHAGE) 500 MG tablet   Oral   Take 1 tablet (500 mg total) by mouth 2 (two) times daily with a meal.   60 tablet   1   . norgestimate-ethinyl estradiol (SPRINTEC 28) 0.25-35 MG-MCG tablet   Oral   Take 1 tablet by mouth daily.   1 Package   11   . promethazine (PHENERGAN) 25 MG tablet   Oral   Take 1 tablet (25 mg total) by mouth every 6 (  six) hours as needed for nausea.   10 tablet   0    BP 138/91  Pulse 76  Temp(Src) 98.6 F (37 C) (Oral)  Resp 18  SpO2 99%  LMP 12/15/2012 Physical Exam  Vitals reviewed. Constitutional: She is oriented to person, place, and time. She appears well-developed and well-nourished. No distress.  HENT:  Head: Normocephalic and atraumatic.  Eyes: EOM are normal. Pupils are equal, round, and reactive to light.  Neck: Normal range of motion. Neck supple. No rigidity. No Brudzinski's sign noted.  Cardiovascular: Normal rate and regular rhythm.   Pulmonary/Chest: Effort normal. No respiratory distress.  Abdominal: Soft. She exhibits no  distension.  Musculoskeletal: Normal range of motion. She exhibits no edema.  Neurological: She is alert and oriented to person, place, and time. She has normal strength. No cranial nerve deficit or sensory deficit. Coordination normal. GCS eye subscore is 4. GCS verbal subscore is 5. GCS motor subscore is 6.  Skin: Skin is warm and dry.  Psychiatric: She has a normal mood and affect. Her behavior is normal.    ED Course  Procedures (including critical care time) Labs Reviewed - No data to display No results found. No diagnosis found.  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 Per pt hx she had a negative CT head 2 months ago. Has never seen neurology, no hx of dvt/pe. Pt refusing IV meds. Will give PO compazine, decadron and benadryl. Will refrain from CT at this time.   Reassessed, vitals stable, NAD, HA resolved. Likely benign etiology. Stable for d/c home. Recommend fup w/ wellness center for further eval of frequent HAs. Given return precautions.   Case discussed w/ Dr Manus Gunning  1. Headache    [facility]     [address] [place] [address]  Schedule an appointment as soon as possible for a visit on 01/12/2013 for frequent headaches     Audelia Hives, MD 01/08/13 [phone]

## 2013-01-08 NOTE — ED Notes (Signed)
Pt presents with onset of frontal headache when awakening today.  Pt reports nausea with pain, -photophobia.  Pt took naproxen without relief.  Pt reports h/o same since March, gets 2-3 headaches per week.

## 2013-02-21 ENCOUNTER — Emergency Department (HOSPITAL_COMMUNITY)
Admission: EM | Admit: 2013-02-21 | Discharge: 2013-02-21 | Disposition: A | Discharge status: Home / Self Care | Payer: Self-pay | Attending: Emergency Medicine | Admitting: Emergency Medicine

## 2013-02-21 ENCOUNTER — Encounter (HOSPITAL_COMMUNITY): Payer: Self-pay | Admitting: Emergency Medicine

## 2013-02-21 DIAGNOSIS — J069 Acute upper respiratory infection, unspecified: Secondary | ICD-10-CM | POA: Insufficient documentation

## 2013-02-21 DIAGNOSIS — Z87891 Personal history of nicotine dependence: Secondary | ICD-10-CM | POA: Insufficient documentation

## 2013-02-21 DIAGNOSIS — J3489 Other specified disorders of nose and nasal sinuses: Secondary | ICD-10-CM | POA: Insufficient documentation

## 2013-02-21 DIAGNOSIS — R05 Cough: Secondary | ICD-10-CM | POA: Insufficient documentation

## 2013-02-21 DIAGNOSIS — R059 Cough, unspecified: Secondary | ICD-10-CM | POA: Insufficient documentation

## 2013-02-21 DIAGNOSIS — Z8742 Personal history of other diseases of the female genital tract: Secondary | ICD-10-CM | POA: Insufficient documentation

## 2013-02-21 DIAGNOSIS — Z8619 Personal history of other infectious and parasitic diseases: Secondary | ICD-10-CM | POA: Insufficient documentation

## 2013-02-21 DIAGNOSIS — Z862 Personal history of diseases of the blood and blood-forming organs and certain disorders involving the immune mechanism: Secondary | ICD-10-CM | POA: Insufficient documentation

## 2013-02-21 MED ORDER — PSEUDOEPHEDRINE HCL 30 MG PO TABS: 30.0000 mg | ORAL_TABLET | ORAL | Status: DC | PRN

## 2013-02-21 MED ORDER — GUAIFENESIN-DM 100-10 MG/5ML PO SYRP: 5.0000 mL | ORAL_SOLUTION | Freq: Three times a day (TID) | ORAL | Status: DC | PRN

## 2013-02-21 MED ORDER — BENZONATATE 100 MG PO CAPS: 100.0000 mg | ORAL_CAPSULE | Freq: Three times a day (TID) | ORAL | Status: DC

## 2013-02-21 NOTE — ED Provider Notes (Signed)
CSN: [id number]     Arrival date & time 02/21/13  1156 History     First MD Initiated Contact with Patient 02/21/13 1225     Chief Complaint  Patient presents with  . Sore Throat   (Consider location/radiation/quality/duration/timing/severity/associated sxs/prior Treatment) HPI Patient is a 27 year old female who presented to emergency department today complaining of sore throat and cough. Patient states her symptoms began 5 days ago. States she has had some nasal congestion as well which is improving now. Patient denies any fever, chills. States has been taking over-the-counter cold and flu medications with no improvement. States she is having some improvement with drinking tea. Patient denies any recent sick contacts. Patient denies any difficulty speaking or swallowing. Patient denies any shortness of breath or chest pain. Past Medical History  Diagnosis Date  . PCOS (polycystic ovarian syndrome)   . Abnormal Pap smear 06/22/2010  . Chlamydia   . Anemia   . Vaginal bleeding, abnormal    Past Surgical History  Procedure Laterality Date  . Colposcopy  07/28/2010   History reviewed. No pertinent family history. History  Substance Use Topics  . Smoking status: Former Games developer  . Smokeless tobacco: Never Used  . Alcohol Use: 1.0 oz/week    2 drink(s) per week   OB History   Grav Para Term Preterm Abortions TAB SAB Ect Mult Living   0 0 0 0 0 0 0 0 0 0      Review of Systems  Constitutional: Negative for fever, chills and fatigue.  HENT: Positive for congestion and sore throat. Negative for trouble swallowing, neck pain, neck stiffness and voice change.   Respiratory: Positive for cough. Negative for chest tightness and shortness of breath.   Cardiovascular: Negative for chest pain, palpitations and leg swelling.  Gastrointestinal: Negative for nausea, vomiting, abdominal pain and diarrhea.  Genitourinary: Negative for dysuria, flank pain, vaginal bleeding, vaginal discharge,  vaginal pain and pelvic pain.  Musculoskeletal: Negative for myalgias and arthralgias.  Skin: Negative for rash.  Neurological: Negative for dizziness, weakness and headaches.  All other systems reviewed and are negative.    Allergies  Review of patient's allergies indicates no known allergies.  Home Medications   Current Outpatient Rx  Name  Route  Sig  Dispense  Refill  . Nutritional Supplements (COLD AND FLU PO)   Oral   Take 10 mLs by mouth every 6 (six) hours as needed (sore throat).          BP 128/86  Pulse 87  Temp(Src) 98.6 F (37 C) (Oral)  Resp 18  Ht 5\' 2"  (1.575 m)  SpO2 100% Physical Exam  Nursing note and vitals reviewed. Constitutional: She appears well-developed and well-nourished. No distress.  HENT:  Head: Normocephalic and atraumatic.  Right Ear: Tympanic membrane, external ear and ear canal normal.  Left Ear: Tympanic membrane, external ear and ear canal normal.  Nose: Nose normal.  Mouth/Throat: Uvula is midline, oropharynx is clear and moist and mucous membranes are normal.  Eyes: Conjunctivae are normal.  Neck: Neck supple.  Cardiovascular: Normal rate, regular rhythm and normal heart sounds.   Pulmonary/Chest: Effort normal and breath sounds normal. No respiratory distress. She has no wheezes. She has no rales.  Abdominal: Soft. Bowel sounds are normal. She exhibits no distension. There is no tenderness. There is no rebound.  Musculoskeletal: She exhibits no edema.  Neurological: She is alert.  Skin: Skin is warm and dry.  Psychiatric: She has a normal mood and affect. Her  behavior is normal.    ED Course   Procedures (including critical care time)  Filed Vitals:   02/21/13 1202 02/21/13 1217  BP: 118/80 128/86  Pulse: 80 87  Temp: 98.6 F (37 C) 98.6 F (37 C)  TempSrc: Oral Oral  Resp: 20 18  Height:  5\' 2"  (1.575 m)  SpO2: 98% 100%   Results for orders placed during the hospital encounter of 02/21/13  RAPID STREP SCREEN       Result Value Range   Streptococcus, Group A Screen (Direct) NEGATIVE  NEGATIVE   No results found.    No results found. 1. Viral URI     MDM  Patient with upper respiratory symptoms for 5 days. Patient's nasal congestion, sore throat, cough. Patient's exam is unremarkable. In fact her pharynx is normal color, no tonsillar edema, not a good day, uvula is midline. Patient's lungs are clear. She is in no distress. Her vital signs are completely normal, with oxygen saturation 100% on room air, and normal temperature, heart rate and blood pressure. I suspect patient has a viral upper respiratory tract infection. She works at [employer] facility and comes in contact with a lot of sick residents. I will treat her symptomatically, with nasal saline spray, Sudafed, Robitussin-DM for cough. She is instructed to rest, drink clear fluids, Tylenol and Motrin for pain, and follow with a primary care doctor  Lottie Mussel, PA-C 02/21/13 1328

## 2013-02-21 NOTE — ED Provider Notes (Signed)
Medical screening examination/treatment/procedure(s) were performed by non-physician practitioner and as supervising physician I was immediately available for consultation/collaboration.   William Korine Winton, MD 02/21/13 1437 

## 2013-02-21 NOTE — ED Notes (Addendum)
Pt comes from home w/ c/o of sore throat since Monday. Pt does have throat inflammation and redness. Denies fever. Took Naproxen and Nyquil around 6 am.

## 2013-02-23 LAB — CULTURE, GROUP A STREP

## 2014-05-12 ENCOUNTER — Ambulatory Visit (INDEPENDENT_AMBULATORY_CARE_PROVIDER_SITE_OTHER): Payer: 59 | Admitting: Obstetrics & Gynecology

## 2014-05-12 ENCOUNTER — Encounter: Payer: Self-pay | Admitting: Obstetrics & Gynecology

## 2014-05-12 VITALS — BP 146/87 | HR 73 | Temp 98.5°F | Ht 62.0 in | Wt 223.2 lb

## 2014-05-12 DIAGNOSIS — Z01419 Encounter for gynecological examination (general) (routine) without abnormal findings: Secondary | ICD-10-CM

## 2014-05-12 DIAGNOSIS — Z0000 Encounter for general adult medical examination without abnormal findings: Secondary | ICD-10-CM

## 2014-05-12 NOTE — Progress Notes (Signed)
Ref[facility] filled out and faxed to [phone] along with patient visit information. Patient to be contacted by Dr. Lyndal RainbowYalcinkaya's office. His office number and location information given to patient as well.

## 2014-05-12 NOTE — Progress Notes (Signed)
Subjective:    Carolyn Cortez is a 28 y.o. S AA G0 female who presents for an annual exam. The patient has no complaints today. The patient is sexually active. GYN screening history: last pap: was normal. The patient wears seatbelts: yes. The patient participates in regular exercise: no. Has the patient ever been transfused or tattooed?: not asked. The patient reports that there is not domestic violence in her life.   Menstrual History: OB History    Gravida Para Term Preterm AB TAB SAB Ectopic Multiple Living   0 0 0 0 0 0 0 0 0 0       Menarche age: 89  Patient's last menstrual period was 05/06/2014 (exact date).    The following portions of the patient's history were reviewed and updated as appropriate: allergies, current medications, past family history, past medical history, past social history, past surgical history and problem list.  Review of Systems A comprehensive review of systems was negative. She has been monogamous for a year, lives with her boyfriend. She works in a nursing home. She tells me that since 4/15 her periods have come monthly. She is currently not using birth control.   Objective:    BP 146/87 mmHg  Pulse 73  Temp(Src) 98.5 F (36.9 C) (Oral)  Ht 5\' 2"  (1.575 m)  Wt 223 lb 3.2 oz (101.243 kg)  BMI 40.81 kg/m2  LMP 05/06/2014 (Exact Date)  General Appearance:    Alert, cooperative, no distress, appears stated age  Head:    Normocephalic, without obvious abnormality, atraumatic  Eyes:    PERRL, conjunctiva/corneas clear, EOM's intact, fundi    benign, both eyes  Ears:    Normal TM's and external ear canals, both ears  Nose:   Nares normal, septum midline, mucosa normal, no drainage    or sinus tenderness  Throat:   Lips, mucosa, and tongue normal; teeth and gums normal  Neck:   Supple, symmetrical, trachea midline, no adenopathy;    thyroid:  no enlargement/tenderness/nodules; no carotid   bruit or JVD  Back:     Symmetric, no curvature, ROM normal, no  CVA tenderness  Lungs:     Clear to auscultation bilaterally, respirations unlabored  Chest Wall:    No tenderness or deformity   Heart:    Regular rate and rhythm, S1 and S2 normal, no murmur, rub   or gallop  Breast Exam:    No tenderness, masses, or nipple abnormality  Abdomen:     Soft, non-tender, bowel sounds active all four quadrants,    no masses, no organomegaly  Genitalia:    Normal female without lesion, discharge or tenderness, NSSA, NT no pelvic masses appreciated     Extremities:   Extremities normal, atraumatic, no cyanosis or edema  Pulses:   2+ and symmetric all extremities  Skin:   Skin color, texture, turgor normal, no rashes or lesions  Lymph nodes:   Cervical, supraclavicular, and axillary nodes normal  Neurologic:   CNII-XII intact, normal strength, sensation and reflexes    throughout  .    Assessment:    Healthy female exam.    Plan:     Breast self exam technique reviewed and patient encouraged to perform self-exam monthly. Chlamydia specimen. GC specimen. Thin prep Pap smear.   Refer to Dr. April MansonYalcinkaya prn Rec MVI daily She declines a flu vaccine

## 2014-05-14 LAB — CYTOLOGY - PAP

## 2014-05-19 ENCOUNTER — Telehealth: Payer: Self-pay | Admitting: *Deleted

## 2014-05-19 DIAGNOSIS — A599 Trichomoniasis, unspecified: Secondary | ICD-10-CM

## 2014-05-19 MED ORDER — METRONIDAZOLE 500 MG PO TABS: 2000.0000 mg | ORAL_TABLET | Freq: Once | ORAL | Status: DC

## 2014-05-19 NOTE — Telephone Encounter (Signed)
-----   Message from Allie BossierMyra C Dove, MD sent at 05/19/2014  9:48 AM EST ----- She will need flagyl 2 grams po once to treat her trich. Her partner should be treated and she should not have unprotected (use condom) sex until he is treated. Thanks

## 2014-05-19 NOTE — Telephone Encounter (Signed)
Contacted patient and informed of results, and prescription ordered. Instructions given for partner and recommendation to abstain or to have only protected sex until partner is treated.  Pt verbalized understanding.

## 2014-07-09 ENCOUNTER — Emergency Department (INDEPENDENT_AMBULATORY_CARE_PROVIDER_SITE_OTHER)
Admission: EM | Admit: 2014-07-09 | Discharge: 2014-07-09 | Disposition: A | Discharge status: Home / Self Care | Payer: 59 | Source: Home / Self Care | Attending: Family Medicine | Admitting: Family Medicine

## 2014-07-09 ENCOUNTER — Encounter (HOSPITAL_COMMUNITY): Payer: Self-pay

## 2014-07-09 DIAGNOSIS — L42 Pityriasis rosea: Secondary | ICD-10-CM

## 2014-07-09 MED ORDER — HYDROXYZINE HCL 25 MG PO TABS: 25.0000 mg | ORAL_TABLET | Freq: Three times a day (TID) | ORAL | Status: DC | PRN

## 2014-07-09 NOTE — ED Notes (Signed)
C/o scattered raised round, flat rash on abdominal area, then spreading to chest. NAD, c/o burns

## 2014-07-09 NOTE — Discharge Instructions (Signed)
Pityriasis Rosea  Pityriasis rosea is a rash which is probably caused by a virus. It generally starts as a scaly, red patch on the trunk (the area of the body that a t-shirt would cover) but does not appear on sun exposed areas. The rash is usually preceded by an initial larger spot called the "herald patch" a week or more before the rest of the rash appears. Generally within one to two days the rash appears rapidly on the trunk, upper arms, and sometimes the upper legs. The rash usually appears as flat, oval patches of scaly pink color. The rash can also be raised and one is able to feel it with a finger. The rash can also be finely crinkled and may slough off leaving a ring of scale around the spot. Sometimes a mild sore throat is present with the rash. It usually affects children and young adults in the spring and autumn. Women are more frequently affected than men.  TREATMENT   Pityriasis rosea is a self-limited condition. This means it goes away within 4 to 8 weeks without treatment. The spots may persist for several months, especially in darker-colored skin after the rash has resolved and healed. Benadryl and steroid creams may be used if itching is a problem.  SEEK MEDICAL CARE IF:   · Your rash does not go away or persists longer than three months.  · You develop fever and joint pain.  · You develop severe headache and confusion.  · You develop breathing difficulty, vomiting and/or extreme weakness.  Document Released: 07/25/2001 Document Revised: 09/10/2011 Document Reviewed: 08/13/2008  ExitCare® Patient Information ©2015 [employer]. This information is not intended to replace advice given to you by your health care provider. Make sure you discuss any questions you have with your health care provider.

## 2014-07-09 NOTE — ED Provider Notes (Signed)
CSN: [id number]     Arrival date & time 07/09/14  1219 History   First MD Initiated Contact with Patient 07/09/14 1239     Chief Complaint  Patient presents with  . Rash   (Consider location/radiation/quality/duration/timing/severity/associated sxs/prior Treatment) Patient is a 29 y.o. female presenting with rash. The history is provided by the patient.  Rash Location:  Torso Torso rash location:  L chest, R chest, upper back, abd LUQ and abd RUQ Quality: itchiness   Severity:  Moderate Onset quality:  Gradual Duration:  6 days Timing:  Constant Progression:  Worsening Chronicity:  New Context: not animal contact, not chemical exposure, not exposure to similar rash, not hot tub use, not insect bite/sting, not medications, not new detergent/soap, not nuts, not plant contact, not pregnancy, not sick contacts and not sun exposure   Associated symptoms: no fever, no joint pain, no myalgias, no shortness of breath, no throat swelling and no tongue swelling   Associated symptoms comment:  +mild URI sx   Past Medical History  Diagnosis Date  . PCOS (polycystic ovarian syndrome)   . Abnormal Pap smear 06/22/2010  . Chlamydia   . Anemia   . Vaginal bleeding, abnormal   . Morbid obesity    Past Surgical History  Procedure Laterality Date  . Colposcopy  07/28/2010   History reviewed. No pertinent family history. History  Substance Use Topics  . Smoking status: Former Games developer  . Smokeless tobacco: Never Used  . Alcohol Use: 1.0 oz/week    2 Not specified per week   OB History    Gravida Para Term Preterm AB TAB SAB Ectopic Multiple Living       Review of Systems  Constitutional: Negative for fever.  Respiratory: Negative for shortness of breath.   Musculoskeletal: Negative for myalgias and arthralgias.  Skin: Positive for rash.  All other systems reviewed and are negative.   Allergies  Review of patient's allergies indicates no known allergies.  Home  Medications   Prior to Admission medications   Medication Sig Start Date End Date Taking? Authorizing Provider  benzonatate (TESSALON) 100 MG capsule Take 1 capsule (100 mg total) by mouth every 8 (eight) hours. 02/21/13   Tatyana A Kirichenko, PA-C  guaiFENesin-dextromethorphan (ROBITUSSIN DM) 100-10 MG/5ML syrup Take 5 mLs by mouth 3 (three) times daily as needed for cough. 02/21/13   Tatyana A Kirichenko, PA-C  hydrOXYzine (ATARAX/VISTARIL) 25 MG tablet Take 1 tablet (25 mg total) by mouth every 8 (eight) hours as needed for itching. 07/09/14   Mathis Fare Hansini Clodfelter, PA  metroNIDAZOLE (FLAGYL) 500 MG tablet Take 4 tablets (2,000 mg total) by mouth once. 05/19/14   Allie Bossier, MD  Nutritional Supplements (COLD AND FLU PO) Take 10 mLs by mouth every 6 (six) hours as needed (sore throat).    Historical Provider, MD  pseudoephedrine (SUDAFED) 30 MG tablet Take 1 tablet (30 mg total) by mouth every 4 (four) hours as needed for congestion. 02/21/13   Tatyana A Kirichenko, PA-C   BP 144/86 mmHg  Pulse 80  Temp(Src) 98.8 F (37.1 C) (Oral)  Resp 14  SpO2 100%  LMP 07/05/2014 (Exact Date) Physical Exam  Constitutional: She is oriented to person, place, and time. She appears well-developed and well-nourished. No distress.  HENT:  Head: Normocephalic and atraumatic.  Cardiovascular: Normal rate.   Pulmonary/Chest: Effort normal.  Musculoskeletal: Normal range of motion.  Neurological: She is alert and oriented  to person, place, and time.  Skin: Skin is warm and dry. Rash noted.  +herald patch under left breast with scattered hyperpigmented ovoid shaped plaques with fine white scale on torso and upper arms  Psychiatric: She has a normal mood and affect. Her behavior is normal.  Nursing note and vitals reviewed.   ED Course  Procedures (including critical care time) Labs Review Labs Reviewed - No data to display  Imaging Review No results found.   MDM   1. Pityriasis rosea   Educated  patient about condition Atarax and topical hydrocortisone or topical benadryl for itching Expect improvement over next 4-6 weeks.     Ria ClockJennifer Lee H Dariyon Urquilla, [place] 07/09/14 [phone]

## 2014-09-08 ENCOUNTER — Inpatient Hospital Stay (HOSPITAL_COMMUNITY)
Admission: AD | Admit: 2014-09-08 | Discharge: 2014-09-08 | Disposition: A | Discharge status: Home / Self Care | Payer: 59 | Source: Ambulatory Visit | Attending: Family Medicine | Admitting: Family Medicine

## 2014-09-08 ENCOUNTER — Encounter (HOSPITAL_COMMUNITY): Payer: Self-pay | Admitting: *Deleted

## 2014-09-08 DIAGNOSIS — N939 Abnormal uterine and vaginal bleeding, unspecified: Secondary | ICD-10-CM | POA: Insufficient documentation

## 2014-09-08 HISTORY — DX: Unspecified abnormal cytological findings in specimens from vagina: R87.629

## 2014-09-08 LAB — URINALYSIS, ROUTINE W REFLEX MICROSCOPIC
Bilirubin Urine: NEGATIVE
GLUCOSE, UA: NEGATIVE mg/dL
Ketones, ur: NEGATIVE mg/dL
LEUKOCYTES UA: NEGATIVE
NITRITE: NEGATIVE
PROTEIN: NEGATIVE mg/dL
Specific Gravity, Urine: 1.015 (ref 1.005–1.030)
Urobilinogen, UA: 1 mg/dL (ref 0.0–1.0)
pH: 6 (ref 5.0–8.0)

## 2014-09-08 LAB — URINE MICROSCOPIC-ADD ON

## 2014-09-08 LAB — POCT PREGNANCY, URINE: Preg Test, Ur: NEGATIVE

## 2014-09-08 NOTE — Discharge Instructions (Signed)
Abnormal Uterine Bleeding Abnormal uterine bleeding can affect women at various stages in life, including teenagers, women in their reproductive years, pregnant women, and women who have reached menopause. Several kinds of uterine bleeding are considered abnormal, including:  Bleeding or spotting between periods.   Bleeding after sexual intercourse.   Bleeding that is heavier or more than normal.   Periods that last longer than usual.  Bleeding after menopause.  Many cases of abnormal uterine bleeding are minor and simple to treat, while others are more serious. Any type of abnormal bleeding should be evaluated by your health care provider. Treatment will depend on the cause of the bleeding. HOME CARE INSTRUCTIONS Monitor your condition for any changes. The following actions may help to alleviate any discomfort you are experiencing:  Avoid the use of tampons and douches as directed by your health care provider.  Change your pads frequently. You should get regular pelvic exams and Pap tests. Keep all follow-up appointments for diagnostic tests as directed by your health care provider.  SEEK MEDICAL CARE IF:   Your bleeding lasts more than 1 week.   You feel dizzy at times.  SEEK IMMEDIATE MEDICAL CARE IF:   You pass out.   You are changing pads every 15 to 30 minutes.   You have abdominal pain.  You have a fever.   You become sweaty or weak.   You are passing large blood clots from the vagina.   You start to feel nauseous and vomit. MAKE SURE YOU:   Understand these instructions.  Will watch your condition.  Will get help right away if you are not doing well or get worse. Document Released: 06/18/2005 Document Revised: 06/23/2013 Document Reviewed: 01/15/2013 ExitCare Patient Information 2015 [employer]. This information is not intended to replace advice given to you by your health care provider. Make sure you discuss any questions you have with your  health care provider.  

## 2014-09-08 NOTE — MAU Note (Signed)
No signs of period last night, passed a quarter sized clot when used restroom. Put on a pad, almost nothing on it..... Pink d/c noted when wiped this morning. No pain.

## 2014-10-16 ENCOUNTER — Emergency Department (HOSPITAL_COMMUNITY)
Admission: EM | Admit: 2014-10-16 | Discharge: 2014-10-16 | Disposition: A | Discharge status: Home / Self Care | Payer: 59 | Source: Home / Self Care | Attending: Family Medicine | Admitting: Family Medicine

## 2014-10-16 ENCOUNTER — Encounter (HOSPITAL_COMMUNITY): Payer: Self-pay | Admitting: *Deleted

## 2014-10-16 DIAGNOSIS — K529 Noninfective gastroenteritis and colitis, unspecified: Secondary | ICD-10-CM

## 2014-10-16 MED ORDER — ONDANSETRON 4 MG PO TBDP
4.0000 mg | ORAL_TABLET | Freq: Once | ORAL | Status: AC
  Administered 2014-10-16: 4 mg via ORAL

## 2014-10-16 MED ORDER — ONDANSETRON HCL 4 MG PO TABS: 4.0000 mg | ORAL_TABLET | Freq: Four times a day (QID) | ORAL | Status: DC

## 2014-10-16 MED ORDER — ONDANSETRON 4 MG PO TBDP
ORAL_TABLET | ORAL | Status: AC
  Filled 2014-10-16: qty 1

## 2014-10-16 NOTE — ED Notes (Signed)
Pt  Reports  Symptoms  Of low  abd  Pain  With  Nausea    Diarrhea    And  Headache    Pt  Vomited    Yesterday  None  Today

## 2014-10-16 NOTE — ED Provider Notes (Signed)
CSN: [id number]     Arrival date & time 10/16/14  1255 History   First MD Initiated Contact with Patient 10/16/14 1357     Chief Complaint  Patient presents with  . Abdominal Pain   (Consider location/radiation/quality/duration/timing/severity/associated sxs/prior Treatment) Patient is a 29 y.o. female presenting with abdominal pain. The history is provided by the patient.  Abdominal Pain Pain location:  Generalized Pain quality: cramping   Pain radiates to:  Does not radiate Pain severity:  Mild Onset quality:  Sudden Duration:  12 days Progression:  Unchanged Chronicity:  New Context: suspicious food intake   Context comment:  [place]ent [place] [employer] with boyfriend last eve, both with diarrhea Relieved by:  None tried Worsened by:  Nothing tried Ineffective treatments:  None tried Associated symptoms: diarrhea, nausea and vomiting   Associated symptoms: no fever and no hematochezia     Past Medical History  Diagnosis Date  . PCOS (polycystic ovarian syndrome)   . Abnormal Pap smear 06/22/2010  . Chlamydia   . Anemia   . Vaginal bleeding, abnormal   . Morbid obesity   . Vaginal Pap smear, abnormal    Past Surgical History  Procedure Laterality Date  . Colposcopy  07/28/2010   History reviewed. No pertinent family history. History  Substance Use Topics  . Smoking status: Former Games developermoker  . Smokeless tobacco: Never Used  . Alcohol Use: No   OB History    Gravida Para Term Preterm AB TAB SAB Ectopic Multiple Living   0 0 0 0 0 0 0 0 0 0      Review of Systems  Constitutional: Negative.  Negative for fever.  Gastrointestinal: Positive for nausea, vomiting, abdominal pain and diarrhea. Negative for blood in stool and hematochezia.    Allergies  Review of patient's allergies indicates no known allergies.  Home Medications   Prior to Admission medications   Medication Sig Start Date End Date Taking? Authorizing Provider  benzonatate (TESSALON) 100 MG capsule Take  1 capsule (100 mg total) by mouth every 8 (eight) hours. 02/21/13   Tatyana Kirichenko, PA-C  guaiFENesin-dextromethorphan (ROBITUSSIN DM) 100-10 MG/5ML syrup Take 5 mLs by mouth 3 (three) times daily as needed for cough. 02/21/13   Tatyana Kirichenko, PA-C  hydrOXYzine (ATARAX/VISTARIL) 25 MG tablet Take 1 tablet (25 mg total) by mouth every 8 (eight) hours as needed for itching. 07/09/14   Mathis FareJennifer Lee H Presson, PA  metroNIDAZOLE (FLAGYL) 500 MG tablet Take 4 tablets (2,000 mg total) by mouth once. 05/19/14   Allie BossierMyra C Dove, MD  Nutritional Supplements (COLD AND FLU PO) Take 10 mLs by mouth every 6 (six) hours as needed (sore throat).    Historical Provider, MD  ondansetron (ZOFRAN) 4 MG tablet Take 1 tablet (4 mg total) by mouth every 6 (six) hours. Prn n/v 10/16/14   Linna HoffJames D Kindl, MD  pseudoephedrine (SUDAFED) 30 MG tablet Take 1 tablet (30 mg total) by mouth every 4 (four) hours as needed for congestion. 02/21/13   Tatyana Kirichenko, PA-C   BP 142/91 mmHg  Pulse 79  Temp(Src) 98.4 F (36.9 C) (Oral)  Resp 20  SpO2 97%  LMP 10/09/2014 (Within Days) Physical Exam  Constitutional: She is oriented to person, place, and time. She appears well-developed and well-nourished.  Abdominal: Soft. Bowel sounds are normal. She exhibits no distension and no mass. There is no tenderness. There is no rebound and no guarding.  Neurological: She is alert and oriented to person, place, and time.  Skin:  Skin is warm and dry.  Nursing note and vitals reviewed.   ED Course  Procedures (including critical care time) Labs Review Labs Reviewed - No data to display  Imaging Review No results found.   MDM   1. Gastroenteritis, acute        Linna Hoff, MD 10/16/14 [phone]

## 2014-10-16 NOTE — Discharge Instructions (Signed)
Clear liquid , bland diet today as tolerated, advance on Sunday as improved, use medicine as needed, imodium for diarrhea, return or see your doctor if any problems.

## 2014-10-25 ENCOUNTER — Emergency Department (HOSPITAL_COMMUNITY)
Admission: EM | Admit: 2014-10-25 | Discharge: 2014-10-25 | Disposition: A | Discharge status: Home / Self Care | Payer: 59 | Attending: Emergency Medicine | Admitting: Emergency Medicine

## 2014-10-25 ENCOUNTER — Encounter (HOSPITAL_COMMUNITY): Payer: Self-pay | Admitting: Family Medicine

## 2014-10-25 DIAGNOSIS — R51 Headache: Secondary | ICD-10-CM | POA: Diagnosis not present

## 2014-10-25 DIAGNOSIS — Z3202 Encounter for pregnancy test, result negative: Secondary | ICD-10-CM | POA: Diagnosis not present

## 2014-10-25 DIAGNOSIS — Z87891 Personal history of nicotine dependence: Secondary | ICD-10-CM | POA: Insufficient documentation

## 2014-10-25 DIAGNOSIS — Z79899 Other long term (current) drug therapy: Secondary | ICD-10-CM | POA: Diagnosis not present

## 2014-10-25 DIAGNOSIS — Z862 Personal history of diseases of the blood and blood-forming organs and certain disorders involving the immune mechanism: Secondary | ICD-10-CM | POA: Insufficient documentation

## 2014-10-25 DIAGNOSIS — Z8639 Personal history of other endocrine, nutritional and metabolic disease: Secondary | ICD-10-CM | POA: Insufficient documentation

## 2014-10-25 DIAGNOSIS — R42 Dizziness and giddiness: Secondary | ICD-10-CM | POA: Insufficient documentation

## 2014-10-25 DIAGNOSIS — R519 Headache, unspecified: Secondary | ICD-10-CM

## 2014-10-25 DIAGNOSIS — Z8619 Personal history of other infectious and parasitic diseases: Secondary | ICD-10-CM | POA: Insufficient documentation

## 2014-10-25 LAB — I-STAT CHEM 8, ED
BUN: 11 mg/dL (ref 6–23)
CALCIUM ION: 1.21 mmol/L (ref 1.12–1.23)
Chloride: 104 mmol/L (ref 96–112)
Creatinine, Ser: 0.7 mg/dL (ref 0.50–1.10)
GLUCOSE: 93 mg/dL (ref 70–99)
HCT: 39 % (ref 36.0–46.0)
HEMOGLOBIN: 13.3 g/dL (ref 12.0–15.0)
Potassium: 3.9 mmol/L (ref 3.5–5.1)
Sodium: 139 mmol/L (ref 135–145)
TCO2: 22 mmol/L (ref 0–100)

## 2014-10-25 LAB — URINALYSIS, ROUTINE W REFLEX MICROSCOPIC
Bilirubin Urine: NEGATIVE
GLUCOSE, UA: NEGATIVE mg/dL
Ketones, ur: NEGATIVE mg/dL
Leukocytes, UA: NEGATIVE
Nitrite: NEGATIVE
PROTEIN: NEGATIVE mg/dL
SPECIFIC GRAVITY, URINE: 1.028 (ref 1.005–1.030)
Urobilinogen, UA: 1 mg/dL (ref 0.0–1.0)
pH: 6 (ref 5.0–8.0)

## 2014-10-25 LAB — URINE MICROSCOPIC-ADD ON

## 2014-10-25 LAB — POC URINE PREG, ED: Preg Test, Ur: NEGATIVE

## 2014-10-25 MED ORDER — DIPHENHYDRAMINE HCL 50 MG/ML IJ SOLN
25.0000 mg | Freq: Once | INTRAMUSCULAR | Status: AC
  Administered 2014-10-25: 25 mg via INTRAVENOUS
  Filled 2014-10-25: qty 1

## 2014-10-25 MED ORDER — METOCLOPRAMIDE HCL 5 MG/ML IJ SOLN
10.0000 mg | Freq: Once | INTRAMUSCULAR | Status: AC
  Administered 2014-10-25: 10 mg via INTRAVENOUS
  Filled 2014-10-25: qty 2

## 2014-10-25 MED ORDER — SODIUM CHLORIDE 0.9 % IV BOLUS (SEPSIS)
1000.0000 mL | Freq: Once | INTRAVENOUS | Status: AC
  Administered 2014-10-25: 1000 mL via INTRAVENOUS

## 2014-10-25 NOTE — ED Notes (Signed)
Pt here for dizziness and hypertension. sts she was at work and didn't feel well and felt dizzy. sts they checked her BP and was elevated. sts some nausea.

## 2014-10-25 NOTE — ED Notes (Signed)
MD at bedside. 

## 2014-10-25 NOTE — ED Provider Notes (Signed)
CSN: [id number]     Arrival date & time 10/25/14  1035 History   First MD Initiated Contact with Patient 10/25/14 1120     Chief Complaint  Patient presents with  . Hypertension  . Dizziness     Patient is a 29 y.o. female presenting with hypertension and dizziness. The history is provided by the patient. No language interpreter was used.  Hypertension  Dizziness  Ms. Montez MoritaCarter presents for evaluation of headache and dizziness. She states that she got to work today and felt dizzy. She describes it as a room spinning sensation as well as a lightheadedness. She has an associated frontal headache. The headache is moderate and constant in nature. She had one episode of vomiting at work. Her blood pressure was checked at work and was noted to be 160 systolic and she was referred to the ED for further evaluation. She denies any fevers, numbness, weakness, vision changes. She has a history of headache but this feels different than her previous headaches. Symptoms are moderate and constant.  Past Medical History  Diagnosis Date  . PCOS (polycystic ovarian syndrome)   . Abnormal Pap smear 06/22/2010  . Chlamydia   . Anemia   . Vaginal bleeding, abnormal   . Morbid obesity   . Vaginal Pap smear, abnormal    Past Surgical History  Procedure Laterality Date  . Colposcopy  07/28/2010   History reviewed. No pertinent family history. History  Substance Use Topics  . Smoking status: Former Games developermoker  . Smokeless tobacco: Never Used  . Alcohol Use: No   OB History    Gravida Para Term Preterm AB TAB SAB Ectopic Multiple Living   0 0 0 0 0 0 0 0 0 0      Review of Systems  Neurological: Positive for dizziness.  All other systems reviewed and are negative.     Allergies  Review of patient's allergies indicates no known allergies.  Home Medications   Prior to Admission medications   Medication Sig Start Date End Date Taking? Authorizing Provider  benzonatate (TESSALON) 100 MG capsule Take  1 capsule (100 mg total) by mouth every 8 (eight) hours. 02/21/13   Tatyana Kirichenko, PA-C  guaiFENesin-dextromethorphan (ROBITUSSIN DM) 100-10 MG/5ML syrup Take 5 mLs by mouth 3 (three) times daily as needed for cough. 02/21/13   Tatyana Kirichenko, PA-C  hydrOXYzine (ATARAX/VISTARIL) 25 MG tablet Take 1 tablet (25 mg total) by mouth every 8 (eight) hours as needed for itching. 07/09/14   Mathis FareJennifer Lee H Presson, PA  metroNIDAZOLE (FLAGYL) 500 MG tablet Take 4 tablets (2,000 mg total) by mouth once. 05/19/14   Allie BossierMyra C Dove, MD  Nutritional Supplements (COLD AND FLU PO) Take 10 mLs by mouth every 6 (six) hours as needed (sore throat).    Historical Provider, MD  ondansetron (ZOFRAN) 4 MG tablet Take 1 tablet (4 mg total) by mouth every 6 (six) hours. Prn n/v 10/16/14   Linna HoffJames D Kindl, MD  pseudoephedrine (SUDAFED) 30 MG tablet Take 1 tablet (30 mg total) by mouth every 4 (four) hours as needed for congestion. 02/21/13   Tatyana Kirichenko, PA-C   BP 131/75 mmHg  Pulse 82  Temp(Src) 98.6 F (37 C)  Resp 20  SpO2 100%  LMP 10/09/2014 (Within Days) Physical Exam  Constitutional: She is oriented to person, place, and time. She appears well-developed and well-nourished.  HENT:  Head: Normocephalic and atraumatic.  Right Ear: External ear normal.  Left Ear: External ear normal.  Mouth/Throat: Oropharynx is  clear and moist.  Eyes: EOM are normal. Pupils are equal, round, and reactive to light.  Neck: Neck supple.  Cardiovascular: Normal rate and regular rhythm.   No murmur heard. Pulmonary/Chest: Effort normal and breath sounds normal. No respiratory distress.  Abdominal: Soft. There is no tenderness. There is no rebound and no guarding.  Musculoskeletal: She exhibits no edema or tenderness.  Neurological: She is alert and oriented to person, place, and time. No cranial nerve deficit. Coordination normal.  Skin: Skin is warm and dry.  Psychiatric: She has a normal mood and affect. Her behavior is  normal.  Nursing note and vitals reviewed.   ED Course  Procedures (including critical care time) Labs Review Labs Reviewed  URINALYSIS, ROUTINE W REFLEX MICROSCOPIC - Abnormal; Notable for the following:    Hgb urine dipstick TRACE (*)    All other components within normal limits  URINE MICROSCOPIC-ADD ON  POC URINE PREG, ED  I-STAT CHEM 8, ED    Imaging Review No results found.   EKG Interpretation   Date/Time:  Monday October 25 2014 11:42:09 EDT Ventricular Rate:  80 PR Interval:  182 QRS Duration: 106 QT Interval:  355 QTC Calculation: 409 R Axis:   -21 Text Interpretation:  Sinus rhythm Borderline left axis deviation Low  voltage, precordial leads Baseline wander in lead(s) I III aVL Confirmed  by Lincoln Brigham [phone]) on 10/25/2014 12:06:47 PM      MDM   Final diagnoses:  Acute nonintractable headache, unspecified headache type  Dizziness    Patient here for evaluation of headache and dizziness. On repeat evaluation patient is feeling improved. History and presentation is not consistent with subarachnoid hemorrhage, meningitis, CVA, acute otitis media. Discussed with patient home care for headache as well as PCP follow-up. Patient was hypertensive at work today blood pressure has improved in the emergency department. Presentation is not consistent with hypertensive urgency.    Tilden Fossa, MD 10/25/14 1329

## 2014-10-25 NOTE — Discharge Instructions (Signed)
Dizziness °Dizziness is a common problem. It is a feeling of unsteadiness or light-headedness. You may feel like you are about to faint. Dizziness can lead to injury if you stumble or fall. A person of any age group can suffer from dizziness, but dizziness is more common in older adults. °CAUSES  °Dizziness can be caused by many different things, including: °· Middle ear problems. °· Standing for too long. °· Infections. °· An allergic reaction. °· Aging. °· An emotional response to something, such as the sight of blood. °· Side effects of medicines. °· Tiredness. °· Problems with circulation or blood pressure. °· Excessive use of alcohol or medicines, or illegal drug use. °· Breathing too fast (hyperventilation). °· An irregular heart rhythm (arrhythmia). °· A low red blood cell count (anemia). °· Pregnancy. °· Vomiting, diarrhea, fever, or other illnesses that cause body fluid loss (dehydration). °· Diseases or conditions such as Parkinson's disease, high blood pressure (hypertension), diabetes, and thyroid problems. °· Exposure to extreme heat. °DIAGNOSIS  °Your health care provider will ask about your symptoms, perform a physical exam, and perform an electrocardiogram (ECG) to record the electrical activity of your heart. Your health care provider may also perform other heart or blood tests to determine the cause of your dizziness. These may include: °· Transthoracic echocardiogram (TTE). During echocardiography, sound waves are used to evaluate how blood flows through your heart. °· Transesophageal echocardiogram (TEE). °· Cardiac monitoring. This allows your health care provider to monitor your heart rate and rhythm in real time. °· Holter monitor. This is a portable device that records your heartbeat and can help diagnose heart arrhythmias. It allows your health care provider to track your heart activity for several days if needed. °· Stress tests by exercise or by giving medicine that makes the heart beat  faster. °TREATMENT  °Treatment of dizziness depends on the cause of your symptoms and can vary greatly. °HOME CARE INSTRUCTIONS  °· Drink enough fluids to keep your urine clear or pale yellow. This is especially important in very hot weather. In older adults, it is also important in cold weather. °· Take your medicine exactly as directed if your dizziness is caused by medicines. When taking blood pressure medicines, it is especially important to get up slowly. °· Rise slowly from chairs and steady yourself until you feel okay. °· In the morning, first sit up on the side of the bed. When you feel okay, stand slowly while holding onto something until you know your balance is fine. °· Move your legs often if you need to stand in one place for a long time. Tighten and relax your muscles in your legs while standing. °· Have someone stay with you for 1-2 days if dizziness continues to be a problem. Do this until you feel you are well enough to stay alone. Have the person call your health care provider if he or she notices changes in you that are concerning. °· Do not drive or use heavy machinery if you feel dizzy. °· Do not drink alcohol. °SEEK IMMEDIATE MEDICAL CARE IF:  °· Your dizziness or light-headedness gets worse. °· You feel nauseous or vomit. °· You have problems talking, walking, or using your arms, hands, or legs. °· You feel weak. °· You are not thinking clearly or you have trouble forming sentences. It may take a friend or family member to notice this. °· You have chest pain, abdominal pain, shortness of breath, or sweating. °· Your vision changes. °· You notice   any bleeding. °· You have side effects from medicine that seems to be getting worse rather than better. °MAKE SURE YOU:  °· Understand these instructions. °· Will watch your condition. °· Will get help right away if you are not doing well or get worse. °Document Released: 12/12/2000 Document Revised: 06/23/2013 Document Reviewed: 01/05/2011 °ExitCare®  Patient Information ©2015 [employer]. This information is not intended to replace advice given to you by your health care provider. Make sure you discuss any questions you have with your health care provider. ° °General Headache Without Cause °A headache is pain or discomfort felt around the head or neck area. The specific cause of a headache may not be found. There are many causes and types of headaches. A few common ones are: °· Tension headaches. °· Migraine headaches. °· Cluster headaches. °· Chronic daily headaches. °HOME CARE INSTRUCTIONS  °· Keep all follow-up appointments with your caregiver or any specialist referral. °· Only take over-the-counter or prescription medicines for pain or discomfort as directed by your caregiver. °· Lie down in a dark, quiet room when you have a headache. °· Keep a headache journal to find out what may trigger your migraine headaches. For example, write down: °¨ What you eat and drink. °¨ How much sleep you get. °¨ Any change to your diet or medicines. °· Try massage or other relaxation techniques. °· Put ice packs or heat on the head and neck. Use these 3 to 4 times per day for 15 to 20 minutes each time, or as needed. °· Limit stress. °· Sit up straight, and do not tense your muscles. °· Quit smoking if you smoke. °· Limit alcohol use. °· Decrease the amount of caffeine you drink, or stop drinking caffeine. °· Eat and sleep on a regular schedule. °· Get 7 to 9 hours of sleep, or as recommended by your caregiver. °· Keep lights dim if bright lights bother you and make your headaches worse. °SEEK MEDICAL CARE IF:  °· You have problems with the medicines you were prescribed. °· Your medicines are not working. °· You have a change from the usual headache. °· You have nausea or vomiting. °SEEK IMMEDIATE MEDICAL CARE IF:  °· Your headache becomes severe. °· You have a fever. °· You have a stiff neck. °· You have loss of vision. °· You have muscular weakness or loss of muscle  control. °· You start losing your balance or have trouble walking. °· You feel faint or pass out. °· You have severe symptoms that are different from your first symptoms. °MAKE SURE YOU:  °· Understand these instructions. °· Will watch your condition. °· Will get help right away if you are not doing well or get worse. °Document Released: 06/18/2005 Document Revised: 09/10/2011 Document Reviewed: 07/04/2011 °ExitCare® Patient Information ©2015 [employer]. This information is not intended to replace advice given to you by your health care provider. Make sure you discuss any questions you have with your health care provider. ° °

## 2015-06-09 ENCOUNTER — Ambulatory Visit: Payer: 59 | Admitting: Obstetrics & Gynecology

## 2015-06-09 ENCOUNTER — Ambulatory Visit: Payer: 59 | Admitting: Certified Nurse Midwife

## 2015-06-16 ENCOUNTER — Encounter: Payer: Self-pay | Admitting: Family

## 2015-06-16 ENCOUNTER — Ambulatory Visit (INDEPENDENT_AMBULATORY_CARE_PROVIDER_SITE_OTHER): Payer: 59 | Admitting: Family

## 2015-06-16 VITALS — BP 129/94 | HR 85 | Temp 99.3°F | Ht 62.0 in | Wt 222.1 lb

## 2015-06-16 DIAGNOSIS — Z113 Encounter for screening for infections with a predominantly sexual mode of transmission: Secondary | ICD-10-CM

## 2015-06-16 DIAGNOSIS — Z124 Encounter for screening for malignant neoplasm of cervix: Secondary | ICD-10-CM | POA: Diagnosis not present

## 2015-06-16 DIAGNOSIS — Z01419 Encounter for gynecological examination (general) (routine) without abnormal findings: Secondary | ICD-10-CM

## 2015-06-16 MED ORDER — METFORMIN HCL 500 MG PO TABS: 500.0000 mg | ORAL_TABLET | Freq: Two times a day (BID) | ORAL | Status: DC

## 2015-06-16 NOTE — Progress Notes (Deleted)
   Subjective:    Patient ID: Carolyn Cortez, female    DOB: August 21, 1985, 29 y.o.   MRN: [id number]  HPI    Review of Systems     Objective:   Physical Exam        Assessment & Plan:

## 2015-06-16 NOTE — Patient Instructions (Signed)
  Thank you for enrolling in MyChart. Please follow the instructions below to securely access your online medical record. MyChart allows you to send messages to your doctor, view your test results, manage appointments, and more.   How Do I Sign Up? 1. In your Internet browser, go to [employer] and enter [email or web address]. 2. Click on the Sign Up Now link in the Sign In box. You will see the New Member Sign Up page. 3. Enter your MyChart Access Code exactly as it appears below. You will not need to use this code after you've completed the sign-up process. If you do not sign up before the expiration date, you must request a new code.  MyChart Access Code: [email or web address] Expires: 07/10/2015 11:19 AM  4. Enter your Social Security Number ([id number]) and Date of Birth (mm/dd/yyyy) as indicated and click Submit. You will be taken to the next sign-up page. 5. Create a MyChart ID. This will be your MyChart login ID and cannot be changed, so think of one that is secure and easy to remember. 6. Create a MyChart password. You can change your password at any time. 7. Enter your Password Reset Question and Answer. This can be used at a later time if you forget your password.  8. Enter your e-mail address. You will receive e-mail notification when new information is available in MyChart. 9. Click Sign Up. You can now view your medical record.   Additional Information Remember, MyChart is NOT to be used for urgent needs. For medical emergencies, dial 911.

## 2015-06-16 NOTE — Progress Notes (Signed)
  Subjective:     Carolyn Cortez is a 29 y.o. female here for a routine exam.  Current complaints: no complaints.  Personal health questionnaire reviewed: not asked.   Gynecologic History Patient's last menstrual period was 05/10/2015. Contraception: none Last Pap:. November 2015 Results were: normal Last mammogram: n/a.   Obstetric History OB History  Gravida Para Term Preterm AB SAB TAB Ectopic Multiple Living  0 0 0 0 0 0 0 0 0 0        No current outpatient prescriptions on file prior to visit.   No current facility-administered medications on file prior to visit.   Past Medical History  Diagnosis Date  . PCOS (polycystic ovarian syndrome)   . Abnormal Pap smear 06/22/2010  . Chlamydia   . Anemia   . Vaginal bleeding, abnormal   . Morbid obesity (HCC)   . Vaginal Pap smear, abnormal     The following portions of the patient's history were reviewed and updated as appropriate: allergies, current medications, past family history, past medical history, past social history, past surgical history and problem list.  Review of Systems Pertinent items are noted in HPI.    Objective:   BP 129/94 mmHg  Pulse 85  Temp(Src) 99.3 F (37.4 C)  Ht 5\' 2"  (1.575 m)  Wt 222 lb 1.6 oz (100.744 kg)  BMI 40.61 kg/m2  LMP 05/10/2015 General appearance: alert, cooperative and appears stated age Head: Normocephalic, without obvious abnormality, atraumatic Neck: no adenopathy, no carotid bruit, no JVD, supple, symmetrical, trachea midline and thyroid not enlarged, symmetric, no tenderness/mass/nodules Lungs: clear to auscultation bilaterally Breasts: normal appearance, no masses or tenderness, No nipple retraction or dimpling, No nipple discharge or bleeding, No axillary or supraclavicular adenopathy, Normal to palpation without dominant masses, Taught monthly breast self examination Heart: regular rate and rhythm, S1, S2 normal, no murmur, click, rub or gallop Abdomen: soft,  non-tender; bowel sounds normal; no masses,  no organomegaly Pelvic: cervix normal in appearance, external genitalia normal, no adnexal masses or tenderness, no cervical motion tenderness, rectovaginal septum normal, uterus normal size, shape, and consistency and vagina normal without discharge Skin: Skin color, texture, turgor normal. No rashes or lesions     Assessment:    Well Woman Exam.   PCOS   Plan:    Education reviewed: low fat, low cholesterol diet and safe sex/STD prevention.    RX Metformin 500 mg BID (PCOS) Declines family planning Pap smear and GC/Ct Follow-up prn  Marlis EdelsonWalidah N Karim, CNM

## 2015-06-21 LAB — CYTOLOGY - PAP

## 2015-06-22 ENCOUNTER — Encounter: Payer: Self-pay | Admitting: Family

## 2015-07-11 ENCOUNTER — Emergency Department (HOSPITAL_COMMUNITY): Payer: 59

## 2015-07-11 ENCOUNTER — Emergency Department (HOSPITAL_COMMUNITY)
Admission: EM | Admit: 2015-07-11 | Discharge: 2015-07-12 | Disposition: A | Discharge status: Home / Self Care | Payer: 59 | Attending: Emergency Medicine | Admitting: Emergency Medicine

## 2015-07-11 ENCOUNTER — Encounter (HOSPITAL_COMMUNITY): Payer: Self-pay | Admitting: Emergency Medicine

## 2015-07-11 DIAGNOSIS — Z3202 Encounter for pregnancy test, result negative: Secondary | ICD-10-CM | POA: Insufficient documentation

## 2015-07-11 DIAGNOSIS — Z7984 Long term (current) use of oral hypoglycemic drugs: Secondary | ICD-10-CM | POA: Diagnosis not present

## 2015-07-11 DIAGNOSIS — Z862 Personal history of diseases of the blood and blood-forming organs and certain disorders involving the immune mechanism: Secondary | ICD-10-CM | POA: Diagnosis not present

## 2015-07-11 DIAGNOSIS — K5900 Constipation, unspecified: Secondary | ICD-10-CM | POA: Insufficient documentation

## 2015-07-11 DIAGNOSIS — Z87891 Personal history of nicotine dependence: Secondary | ICD-10-CM | POA: Diagnosis not present

## 2015-07-11 DIAGNOSIS — Z8742 Personal history of other diseases of the female genital tract: Secondary | ICD-10-CM | POA: Insufficient documentation

## 2015-07-11 DIAGNOSIS — Z8639 Personal history of other endocrine, nutritional and metabolic disease: Secondary | ICD-10-CM | POA: Diagnosis not present

## 2015-07-11 DIAGNOSIS — Z8619 Personal history of other infectious and parasitic diseases: Secondary | ICD-10-CM | POA: Diagnosis not present

## 2015-07-11 DIAGNOSIS — M25551 Pain in right hip: Secondary | ICD-10-CM | POA: Diagnosis not present

## 2015-07-11 LAB — CBC
HCT: 35.8 % — ABNORMAL LOW (ref 36.0–46.0)
HEMOGLOBIN: 11.5 g/dL — AB (ref 12.0–15.0)
MCH: 23 pg — ABNORMAL LOW (ref 26.0–34.0)
MCHC: 32.1 g/dL (ref 30.0–36.0)
MCV: 71.5 fL — ABNORMAL LOW (ref 78.0–100.0)
Platelets: 377 10*3/uL (ref 150–400)
RBC: 5.01 MIL/uL (ref 3.87–5.11)
RDW: 17 % — ABNORMAL HIGH (ref 11.5–15.5)
WBC: 6 10*3/uL (ref 4.0–10.5)

## 2015-07-11 LAB — COMPREHENSIVE METABOLIC PANEL
ALK PHOS: 76 U/L (ref 38–126)
ALT: 10 U/L — ABNORMAL LOW (ref 14–54)
ANION GAP: 11 (ref 5–15)
AST: 16 U/L (ref 15–41)
Albumin: 4 g/dL (ref 3.5–5.0)
BUN: 9 mg/dL (ref 6–20)
CALCIUM: 9 mg/dL (ref 8.9–10.3)
CO2: 22 mmol/L (ref 22–32)
Chloride: 101 mmol/L (ref 101–111)
Creatinine, Ser: 0.85 mg/dL (ref 0.44–1.00)
GFR calc non Af Amer: >60 mL/min (ref >60)
Glucose, Bld: 107 mg/dL — ABNORMAL HIGH (ref 65–99)
Potassium: 3.7 mmol/L (ref 3.5–5.1)
SODIUM: 134 mmol/L — AB (ref 135–145)
Total Bilirubin: 0.4 mg/dL (ref 0.3–1.2)
Total Protein: 7.5 g/dL (ref 6.5–8.1)

## 2015-07-11 LAB — LIPASE, BLOOD: Lipase: 20 U/L (ref 11–51)

## 2015-07-11 LAB — I-STAT BETA HCG BLOOD, ED (MC, WL, AP ONLY): I-stat hCG, quantitative: <5 m[IU]/mL (ref <5)

## 2015-07-11 MED ORDER — ACETAMINOPHEN 500 MG PO TABS
1000.0000 mg | ORAL_TABLET | Freq: Once | ORAL | Status: AC
  Administered 2015-07-11: 1000 mg via ORAL
  Filled 2015-07-11: qty 2

## 2015-07-11 NOTE — ED Notes (Signed)
Patient transported to X-ray 

## 2015-07-11 NOTE — ED Notes (Signed)
Pt from home for eval of right hip pain with radiation of tingling pain down right leg. Pt ambulatory to triage but also reports constipation and abd pain, LBM 07-10-15. nad noted. Pt denies any urinary symptoms.

## 2015-07-11 NOTE — ED Provider Notes (Signed)
CSN: [id number]     Arrival date & time 07/11/15  2222 History  By signing my name below, I, Gonzella LexKimberly Bianca Gray, attest that this documentation has been prepared under the direction and in the presence of Tomasita CrumbleAdeleke Danely Bayliss, MD. Electronically Signed: Gonzella LexKimberly Bianca Gray, Scribe. 07/11/2015. 11:48 PM.    Chief Complaint  Patient presents with  . Hip Pain  . Constipation     The history is provided by the patient. No language interpreter was used.    HPI Comments: Carolyn Cortez is a 30 y.o. female who presents to the Emergency Department complaining of mild, constant right hip pain onset four and a half hours ago with radiation down her upper right leg. She has taken ibuprofen with slight relief. 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 She also complains of constipation onset four and a half hours ago for which she has taken Senna with no relief of symptoms yet. Pt denies any recent hip injuries, any past hx of hip issues, abdominal pain and back pain.   Past Medical History  Diagnosis Date  . PCOS (polycystic ovarian syndrome)   . Abnormal Pap smear 06/22/2010  . Chlamydia   . Anemia   . Vaginal bleeding, abnormal   . Morbid obesity (HCC)   . Vaginal Pap smear, abnormal    Past Surgical History  Procedure Laterality Date  . Colposcopy  07/28/2010   No family history on file. Social History  Substance Use Topics  . Smoking status: Former Games developermoker  . Smokeless tobacco: Never Used  . Alcohol Use: No   OB History    Gravida Para Term Preterm AB TAB SAB Ectopic Multiple Living   0 0 0 0 0 0 0 0 0 0      Review of Systems  Gastrointestinal: Positive for constipation. Negative for abdominal pain.  Musculoskeletal: Positive for myalgias and arthralgias. Negative for back pain.  All other systems reviewed and are negative.   Allergies  Review of patient's allergies indicates no  known allergies.  Home Medications   Prior to Admission medications   Medication Sig Start Date End Date Taking? Authorizing Provider  ibuprofen (ADVIL,MOTRIN) 200 MG tablet Take 200 mg by mouth every 6 (six) hours as needed.    Historical Provider, MD  metFORMIN (GLUCOPHAGE) 500 MG tablet Take 1 tablet (500 mg total) by mouth 2 (two) times daily with a meal. 06/16/15   Marlis EdelsonWalidah N Karim, CNM   BP 140/100 mmHg  Pulse 90  Temp(Src) 98 F (36.7 C) (Oral)  Resp 16  Ht 5\' 2"  (1.575 m)  Wt 225 lb (102.059 kg)  BMI 41.14 kg/m2  SpO2 98%  LMP 05/10/2015 Physical Exam  Constitutional: She is oriented to person, place, and time. She appears well-developed and well-nourished. No distress.  HENT:  Head: Normocephalic and atraumatic.  Nose: Nose normal.  Mouth/Throat: Oropharynx is clear and moist. No oropharyngeal exudate.  Eyes: Conjunctivae and EOM are normal. Pupils are equal, round, and reactive to light. No scleral icterus.  Neck: Normal range of motion. Neck supple. No JVD present. No tracheal deviation present. No thyromegaly present.  Cardiovascular: Normal rate, regular rhythm and normal heart sounds.  Exam reveals no gallop and no friction rub.   No murmur heard. Pulmonary/Chest: Effort normal and breath sounds normal. No respiratory distress. She has no wheezes. She exhibits no tenderness.  Abdominal: Soft. Bowel  sounds are normal. She exhibits no distension and no mass. There is no tenderness. There is no rebound and no guarding.  Musculoskeletal: Normal range of motion. She exhibits no edema or tenderness.  Right lateral thigh she has mobile soft tissue swelling Non tender.  Normal ROM at R hip without any pain.  Patient able to ambulate without difficulty down the hallway to the restroom   Lymphadenopathy:    She has no cervical adenopathy.  Neurological: She is alert and oriented to person, place, and time. No cranial nerve deficit. She exhibits normal muscle tone.  Skin:  Skin is warm and dry. No rash noted. No erythema. No pallor.  Nursing note and vitals reviewed.   ED Course  Procedures  DIAGNOSTIC STUDIES:    Oxygen Saturation is 98% on RA, normal by my interpretation.   COORDINATION OF CARE:  11:24 PM Will order x rays of pt's hip and leg. Will administer pt laxative before discharge from the ED. Discussed treatment plan with pt at bedside and pt agreed to plan.   Labs Review Labs Reviewed  COMPREHENSIVE METABOLIC PANEL - Abnormal; Notable for the following:    Sodium 134 (*)    Glucose, Bld 107 (*)    ALT 10 (*)    All other components within normal limits  CBC - Abnormal; Notable for the following:    Hemoglobin 11.5 (*)    HCT 35.8 (*)    MCV 71.5 (*)    MCH 23.0 (*)    RDW 17.0 (*)    All other components within normal limits  URINALYSIS, ROUTINE W REFLEX MICROSCOPIC (NOT AT [facility]) - Abnormal; Notable for the following:    APPearance CLOUDY (*)    Specific Gravity, Urine 1.031 (*)    Hgb urine dipstick MODERATE (*)    Leukocytes, UA TRACE (*)    All other components within normal limits  URINE MICROSCOPIC-ADD ON - Abnormal; Notable for the following:    Squamous Epithelial / LPF 0-5 (*)    Bacteria, UA MANY (*)    All other components within normal limits  LIPASE, BLOOD  I-STAT BETA HCG BLOOD, ED ([facility], [facility], [facility] ONLY)    Imaging Review Dg Hip Unilat With Pelvis 2-3 Views Right  07/12/2015  CLINICAL DATA:  30 year old female with right hip pain. EXAM: DG HIP (WITH OR WITHOUT PELVIS) 2-3V RIGHT; RIGHT FEMUR 2 VIEWS COMPARISON:  None. FINDINGS: There is no acute fracture or dislocation. There is apparent symmetric prominence of the acetabular roofs bilaterally with mild over-coverage of the femoral heads. A pincer type femoro-acetabular impingement is not excluded. Clinical correlation is recommended. The soft tissues are unremarkable. IMPRESSION: No acute fracture or dislocation. Electronically Signed   By: Elgie Collard M.D.   On:  07/12/2015 00:22   Dg Femur, Min 2 Views Right  07/12/2015  CLINICAL DATA:  30 year old female with right hip pain. EXAM: DG HIP (WITH OR WITHOUT PELVIS) 2-3V RIGHT; RIGHT FEMUR 2 VIEWS COMPARISON:  None. FINDINGS: There is no acute fracture or dislocation. There is apparent symmetric prominence of the acetabular roofs bilaterally with mild over-coverage of the femoral heads. A pincer type femoro-acetabular impingement is not excluded. Clinical correlation is recommended. The soft tissues are unremarkable. IMPRESSION: No acute fracture or dislocation. Electronically Signed   By: Elgie Collard M.D.   On: 07/12/2015 00:22   I have personally reviewed and evaluated these images and lab results as part of my medical decision-making.  MDM   Final diagnoses:  None   Patient presents to the ED for R hip pain and constipation.  Xrays do not show any acute cause for her pain.  She was given tylenol in the ED.  Advised to see PCP within 3 days for close follow up.  She was given mag citrate upon DC for constipation.  She refused enema.  She appears well and in NAD.  VS remain within her normal limits and she is safe for DC.   I personally performed the services described in this documentation, which was scribed in my presence. The recorded information has been reviewed and is accurate.       Tomasita Crumble, MD 07/12/15 [phone]

## 2015-07-11 NOTE — ED Notes (Signed)
MD at bedside. 

## 2015-07-11 NOTE — ED Notes (Signed)
Pt states that she took 500 mg of ibuprofen around 10:15 this evening prior to coming to the hospital. Pt states that this has helped with her pain.

## 2015-07-11 NOTE — ED Notes (Signed)
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

## 2015-07-12 LAB — URINALYSIS, ROUTINE W REFLEX MICROSCOPIC
Bilirubin Urine: NEGATIVE
Glucose, UA: NEGATIVE mg/dL
Ketones, ur: NEGATIVE mg/dL
Nitrite: NEGATIVE
Protein, ur: NEGATIVE mg/dL
Specific Gravity, Urine: 1.031 — ABNORMAL HIGH (ref 1.005–1.030)
pH: 5.5 (ref 5.0–8.0)

## 2015-07-12 LAB — URINE MICROSCOPIC-ADD ON

## 2015-07-12 MED ORDER — MAGNESIUM CITRATE PO SOLN
1.0000 | Freq: Once | ORAL | Status: AC
  Administered 2015-07-12: 1 via ORAL
  Filled 2015-07-12: qty 296

## 2015-07-12 NOTE — Discharge Instructions (Signed)
Hip Pain Ms. Carolyn Cortez, you xray did not show any cause for your pain.  Please continue to take ibuprofen as needed for your hip pain and see your primary care doctor within 3 days for close follow up.  Use the diet below to help with your constipation.  If any symptoms worsen, come back to the ED immediately.  Thank you.  Your hip is the joint between your upper legs and your lower pelvis. The bones, cartilage, tendons, and muscles of your hip joint perform a lot of work each day supporting your body weight and allowing you to move around. Hip pain can range from a minor ache to severe pain in one or both of your hips. Pain may be felt on the inside of the hip joint near the groin, or the outside near the buttocks and upper thigh. You may have swelling or stiffness as well.  HOME CARE INSTRUCTIONS   Take medicines only as directed by your health care provider.  Apply ice to the injured area:  Put ice in a plastic bag.  Place a towel between your skin and the bag.  Leave the ice on for 15-20 minutes at a time, 3-4 times a day.  Keep your leg raised (elevated) when possible to lessen swelling.  Avoid activities that cause pain.  Follow specific exercises as directed by your health care provider.  Sleep with a pillow between your legs on your most comfortable side.  Record how often you have hip pain, the location of the pain, and what it feels like. SEEK MEDICAL CARE IF:   You are unable to put weight on your leg.  Your hip is red or swollen or very tender to touch.  Your pain or swelling continues or worsens after 1 week.  You have increasing difficulty walking.  You have a fever. SEEK IMMEDIATE MEDICAL CARE IF:   You have fallen.  You have a sudden increase in pain and swelling in your hip. MAKE SURE YOU:   Understand these instructions.  Will watch your condition.  Will get help right away if you are not doing well or get worse.   This information is not intended to  replace advice given to you by your health care provider. Make sure you discuss any questions you have with your health care provider.   Document Released: 12/06/2009 Document Revised: 07/09/2014 Document Reviewed: 02/12/2013 Elsevier Interactive Patient Education 2016 Elsevier Inc. High-Fiber Diet Fiber, also called dietary fiber, is a type of carbohydrate found in fruits, vegetables, whole grains, and beans. A high-fiber diet can have many health benefits. Your health care provider may recommend a high-fiber diet to help:  Prevent constipation. Fiber can make your bowel movements more regular.  Lower your cholesterol.  Relieve hemorrhoids, uncomplicated diverticulosis, or irritable bowel syndrome.  Prevent overeating as part of a weight-loss plan.  Prevent heart disease, type 2 diabetes, and certain cancers. WHAT IS MY PLAN? The recommended daily intake of fiber includes:  38 grams for men under age 28.  30 grams for men over age 28.  25 grams for women under age 28.  21 grams for women over age 28. You can get the recommended daily intake of dietary fiber by eating a variety of fruits, vegetables, grains, and beans. Your health care provider may also recommend a fiber supplement if it is not possible to get enough fiber through your diet. WHAT DO I NEED TO KNOW ABOUT A HIGH-FIBER DIET?  Fiber supplements have not been  widely studied for their effectiveness, so it is better to get fiber through food sources.  Always check the fiber content on thenutrition facts label of any prepackaged food. Look for foods that contain at least 5 grams of fiber per serving.  Ask your dietitian if you have questions about specific foods that are related to your condition, especially if those foods are not listed in the following section.  Increase your daily fiber consumption gradually. Increasing your intake of dietary fiber too quickly may cause bloating, cramping, or gas.  Drink plenty of  water. Water helps you to digest fiber. WHAT FOODS CAN I EAT? Grains Whole-grain breads. Multigrain cereal. Oats and oatmeal. Brown rice. Barley. Bulgur wheat. Millet. Bran muffins. Popcorn. Rye wafer crackers. Vegetables Sweet potatoes. Spinach. Kale. Artichokes. Cabbage. Broccoli. Green peas. Carrots. Squash. Fruits Berries. Pears. Apples. Oranges. Avocados. Prunes and raisins. Dried figs. Meats and Other Protein Sources Navy, kidney, pinto, and soy beans. Split peas. Lentils. Nuts and seeds. Dairy Fiber-fortified yogurt. Beverages Fiber-fortified soy milk. Fiber-fortified orange juice. Other Fiber bars. The items listed above may not be a complete list of recommended foods or beverages. Contact your dietitian for more options. WHAT FOODS ARE NOT RECOMMENDED? Grains White bread. Pasta made with refined flour. White rice. Vegetables Fried potatoes. Canned vegetables. Well-cooked vegetables.  Fruits Fruit juice. Cooked, strained fruit. Meats and Other Protein Sources Fatty cuts of meat. Fried Environmental education officer or fried fish. Dairy Milk. Yogurt. Cream cheese. Sour cream. Beverages Soft drinks. Other Cakes and pastries. Butter and oils. The items listed above may not be a complete list of foods and beverages to avoid. Contact your dietitian for more information. WHAT ARE SOME TIPS FOR INCLUDING HIGH-FIBER FOODS IN MY DIET?  Eat a wide variety of high-fiber foods.  Make sure that half of all grains consumed each day are whole grains.  Replace breads and cereals made from refined flour or white flour with whole-grain breads and cereals.  Replace white rice with brown rice, bulgur wheat, or millet.  Start the day with a breakfast that is high in fiber, such as a cereal that contains at least 5 grams of fiber per serving.  Use beans in place of meat in soups, salads, or pasta.  Eat high-fiber snacks, such as berries, raw vegetables, nuts, or popcorn.   This information is not  intended to replace advice given to you by your health care provider. Make sure you discuss any questions you have with your health care provider.   Document Released: 06/18/2005 Document Revised: 07/09/2014 Document Reviewed: 12/01/2013 Elsevier Interactive Patient Education [employer]

## 2015-07-27 ENCOUNTER — Telehealth: Payer: Self-pay | Admitting: General Practice

## 2015-07-27 NOTE — Telephone Encounter (Signed)
Patient called and left message stating she went to the ER recently for constipation and was asked to give a urine sample. Patient states she didn't clean well before the sample & was told there was a little bacteria in her urine but wouldn't treat her for anything since she was not having symptoms. Patient requests call back. Called patient and she states she wants to know if that is normal to have bacteria in her urine. Told patient it can be if she did not clean well before the sample. Asked the patient if she was having UTI symptoms. Patient states no she just wanted to make sure that was normal. Patient had no other questions

## 2016-07-02 HISTORY — PX: LAPAROSCOPIC GASTRIC SLEEVE RESECTION: SHX5895

## 2016-10-16 ENCOUNTER — Telehealth: Payer: Self-pay | Admitting: *Deleted

## 2016-10-16 NOTE — Telephone Encounter (Signed)
Pt left message yesterday requesting a note or letter stating that we have told her she is obese, is gaining weight and have put her on Metformin for PCOS. She further stated that she needs proof of being on Metformin and having PCOS. She did not say why she needs the letter.  Per chart review, pt was last seen @ office on 06/16/15 and was given Rx for 1 year's worth of Metformin. No refills have been authorized since then. Pt should have follow up appt in office prior to providing the requested letter since medical information and status may have changed.

## 2016-10-17 NOTE — Telephone Encounter (Signed)
Patient called again, states that one of her doctors is requesting notes stating she was told she is obese, has pcos, and takes metformin. Please return call.

## 2016-10-17 NOTE — Telephone Encounter (Signed)
Pt requesting a note stating that she is obese and was placed on metformin for this. Advised patient that she is due for a visit and can discuss this when she comes in.

## 2016-11-08 ENCOUNTER — Ambulatory Visit (INDEPENDENT_AMBULATORY_CARE_PROVIDER_SITE_OTHER): Payer: BLUE CROSS/BLUE SHIELD | Admitting: Obstetrics & Gynecology

## 2016-11-08 ENCOUNTER — Encounter: Payer: Self-pay | Admitting: Obstetrics & Gynecology

## 2016-11-08 DIAGNOSIS — Z01419 Encounter for gynecological examination (general) (routine) without abnormal findings: Secondary | ICD-10-CM | POA: Diagnosis not present

## 2016-11-08 DIAGNOSIS — Z113 Encounter for screening for infections with a predominantly sexual mode of transmission: Secondary | ICD-10-CM | POA: Diagnosis not present

## 2016-11-08 NOTE — Progress Notes (Signed)
[facility] [place] @ [phone] to return call back in regards to referral request from the pt.

## 2016-11-08 NOTE — Progress Notes (Signed)
Subjective:    Carolyn Cortez is a 31 y.o. S AAP0  female who presents for an annual exam. The patient has no complaints today. The patient is not currently sexually active. GYN screening history: last pap: was normal. The patient wears seatbelts: yes. The patient participates in regular exercise: no. Has the patient ever been transfused or tattooed?: yes. The patient reports that there is not domestic violence in her life.   Menstrual History: OB History    Gravida Para Term Preterm AB Living   0 0 0 0 0 0   SAB TAB Ectopic Multiple Live Births   0 0 0 0        Menarche age: 89 No LMP recorded.    The following portions of the patient's history were reviewed and updated as appropriate: allergies, current medications, past family history, past medical history, past social history, past surgical history and problem list.  Review of Systems Pertinent items are noted in HPI.   FH- +breast cancer in elderly mGM, no gyn/colon She tried OCPs in the past but gained weight.  Currently abstinent Works as a Engineer, materialsCNA/med tech   Objective:    BP (!) 148/98 Comment: does not take anything for blood pressure  Pulse 96   Wt 232 lb 11.2 oz (105.6 kg)   BMI 42.56 kg/m   General Appearance:    Alert, cooperative, no distress, appears stated age  Head:    Normocephalic, without obvious abnormality, atraumatic  Eyes:    PERRL, conjunctiva/corneas clear, EOM's intact, fundi    benign, both eyes  Ears:    Normal TM's and external ear canals, both ears  Nose:   Nares normal, septum midline, mucosa normal, no drainage    or sinus tenderness  Throat:   Lips, mucosa, and tongue normal; teeth and gums normal  Neck:   Supple, symmetrical, trachea midline, no adenopathy;    thyroid:  no enlargement/tenderness/nodules; no carotid   bruit or JVD  Back:     Symmetric, no curvature, ROM normal, no CVA tenderness  Lungs:     Clear to auscultation bilaterally, respirations unlabored  Chest Wall:    No  tenderness or deformity   Heart:    Regular rate and rhythm, S1 and S2 normal, no murmur, rub   or gallop  Breast Exam:    No tenderness, masses, or nipple abnormality  Abdomen:     Soft, non-tender, bowel sounds active all four quadrants,    no masses, no organomegaly, obese, benign  Genitalia:    Normal female without lesion, discharge or tenderness, nulliparous cervix, no masses     Extremities:   Extremities normal, atraumatic, no cyanosis or edema  Pulses:   2+ and symmetric all extremities  Skin:   Skin color, texture, turgor normal, no rashes or lesions  Lymph nodes:   Cervical, supraclavicular, and axillary nodes normal  Neurologic:   CNII-XII intact, normal strength, sensation and reflexes    throughout  .    Assessment:    Healthy female exam.   PCOS- she is scheduled gastric bypass/sleeve in the future. She a letter and a referral.   Plan:     Chlamydia specimen. GC specimen. Thin prep Pap smear.   I have provided her with a letter for her bypass surgeon as requested.

## 2016-11-08 NOTE — Progress Notes (Signed)
Patient is having gastric sleeve and needs a note stating why she is taking Metformin.

## 2016-11-12 LAB — CYTOLOGY - PAP
Chlamydia: POSITIVE — AB
Diagnosis: NEGATIVE
NEISSERIA GONORRHEA: NEGATIVE

## 2016-11-13 ENCOUNTER — Telehealth: Payer: Self-pay

## 2016-11-13 MED ORDER — AZITHROMYCIN 250 MG PO TABS: 1000.0000 mg | ORAL_TABLET | Freq: Once | ORAL | 0 refills | Status: AC

## 2016-11-13 MED ORDER — METRONIDAZOLE 500 MG PO TABS: 2000.0000 mg | ORAL_TABLET | Freq: Once | ORAL | 0 refills | Status: AC

## 2016-11-13 NOTE — Telephone Encounter (Signed)
Received fax regarding pt positive for chlamydia and needs tx.  Looking at pt's pap smear pt also tested + Trich.  Pt notified of results and advised to tell her partner(s) as well.  Pt stated understanding with no further questions.

## 2017-01-08 ENCOUNTER — Emergency Department (HOSPITAL_COMMUNITY)
Admission: EM | Admit: 2017-01-08 | Discharge: 2017-01-08 | Disposition: A | Discharge status: Home / Self Care | Payer: BLUE CROSS/BLUE SHIELD | Attending: Emergency Medicine | Admitting: Emergency Medicine

## 2017-01-08 ENCOUNTER — Encounter (HOSPITAL_COMMUNITY): Payer: Self-pay | Admitting: Emergency Medicine

## 2017-01-08 DIAGNOSIS — R05 Cough: Secondary | ICD-10-CM | POA: Insufficient documentation

## 2017-01-08 DIAGNOSIS — J029 Acute pharyngitis, unspecified: Secondary | ICD-10-CM | POA: Diagnosis not present

## 2017-01-08 DIAGNOSIS — Z7984 Long term (current) use of oral hypoglycemic drugs: Secondary | ICD-10-CM | POA: Insufficient documentation

## 2017-01-08 LAB — RAPID STREP SCREEN (MED CTR MEBANE ONLY): Streptococcus, Group A Screen (Direct): NEGATIVE

## 2017-01-08 MED ORDER — FLUTICASONE PROPIONATE 50 MCG/ACT NA SUSP: 1.0000 | Freq: Every day | NASAL | 2 refills | Status: DC

## 2017-01-08 NOTE — ED Provider Notes (Signed)
[facility]-EMERGENCY DEPT Provider Note   CSN: [id number] Arrival date & time: 01/08/17  1224   By signing my name below, I, Soijett Blue, attest that this documentation has been prepared under the direction and in the presence of Burna FortsJeff Priseis Cratty, PA-C Electronically Signed: Soijett Blue, ED Scribe. 01/08/17. 6:06 PM.  History   Chief Complaint Chief Complaint  Patient presents with  . Sore Throat    HPI Carolyn Cortez is a 31 y.o. female who presents to the Emergency Department complaining of sore throat onset last night. Pt reports associated post-nasal drip, generalized body aches, cough, and painful swallowing. Pt has not tried any medications for the relief of her symptoms. She notes that she had a hx of similar symptoms that she treated with theraflu. Pt reports that her symptoms may be due to seasonal allergies, although she doesn have an official diagnosis. She denies fever, chills, trouble swallowing, rhinorrhea, and any other symptoms. Denies sick contacts. Denies allergies to medications.    The history is provided by the patient. No language interpreter was used.    Past Medical History:  Diagnosis Date  . Abnormal Pap smear 06/22/2010  . Anemia   . Chlamydia   . Morbid obesity (HCC)   . PCOS (polycystic ovarian syndrome)   . Vaginal bleeding, abnormal   . Vaginal Pap smear, abnormal     Patient Active Problem List   Diagnosis Date Noted  . History of chlamydia 04/09/2011  . PCOS (polycystic ovarian syndrome)   . History of abnormal cervical Pap smear 06/22/2010    Past Surgical History:  Procedure Laterality Date  . COLPOSCOPY  07/28/2010    OB History    Gravida Para Term Preterm AB Living   0 0 0 0 0 0   SAB TAB Ectopic Multiple Live Births   0 0 0 0         Home Medications    Prior to Admission medications   Medication Sig Start Date End Date Taking? Authorizing Provider  fluticasone (FLONASE) 50 MCG/ACT nasal spray Place 1 spray into both nostrils  daily. 01/08/17   Jahmere Bramel, Tinnie GensJeffrey, PA-C  ibuprofen (ADVIL,MOTRIN) 200 MG tablet Take 400 mg by mouth every 6 (six) hours as needed for moderate pain.     [provider]  metFORMIN (GLUCOPHAGE) 500 MG tablet Take 1 tablet (500 mg total) by mouth 2 (two) times daily with a meal. 06/16/15   Marlis EdelsonKarim, Walidah N, CNM    Family History No family history on file.  Social History Social History  Substance Use Topics  . Smoking status: Former Games developermoker  . Smokeless tobacco: Never Used  . Alcohol use No     Allergies   Patient has no known allergies.   Review of Systems Review of Systems  Constitutional: Negative for chills and fever.  HENT: Positive for postnasal drip and sore throat. Negative for rhinorrhea and trouble swallowing.        +painful swallowing  Respiratory: Positive for cough.   Musculoskeletal: Positive for myalgias.     Physical Exam Updated Vital Signs BP (!) 141/91 (BP Location: Left Arm)   Pulse 87   Temp 98.9 F (37.2 C) (Oral)   Resp 18   Ht 5\' 2"  (1.575 m)   Wt 238 lb (108 kg)   LMP 01/01/2017   SpO2 100%   BMI 43.53 kg/m   Physical Exam  Constitutional: She is oriented to person, place, and time. She appears well-developed and well-nourished. No distress.  HENT:  Head: Normocephalic and atraumatic.  Mouth/Throat: Uvula is midline, oropharynx is clear and moist and mucous membranes are normal. No oropharyngeal exudate, posterior oropharyngeal edema or posterior oropharyngeal erythema.  Eyes: EOM are normal.  Neck: Neck supple.  Cardiovascular: Normal rate, regular rhythm and normal heart sounds.  Exam reveals no gallop and no friction rub.   No murmur heard. Pulmonary/Chest: Effort normal and breath sounds normal. No respiratory distress. She has no wheezes. She has no rales.  Abdominal: She exhibits no distension.  Musculoskeletal: Normal range of motion.  Neurological: She is alert and oriented to person, place, and time.  Skin: Skin is  warm and dry.  Psychiatric: She has a normal mood and affect. Her behavior is normal.  Nursing note and vitals reviewed.    ED Treatments / Results  DIAGNOSTIC STUDIES: Oxygen Saturation is 100% on RA, nl by my interpretation.    COORDINATION OF CARE: 1:48 PM Discussed treatment plan with pt at bedside which includes rapid strep screen and culture and pt agreed to plan.   Labs (all labs ordered are listed, but only abnormal results are displayed) Labs Reviewed  RAPID STREP SCREEN (NOT AT [facility])  CULTURE, GROUP A STREP [facility])    EKG  EKG Interpretation None       Radiology No results found.  Procedures Procedures (including critical care time)  Medications Ordered in ED Medications - No data to display   Initial Impression / Assessment and Plan / ED Course  I have reviewed the triage vital signs and the nursing notes.  Pertinent labs & imaging results that were available during my care of the patient were reviewed by me and considered in my medical decision making (see chart for details).     Labs:   Imaging:  Consults:  Therapeutics:  Discharge Meds:   Assessment/Plan:  Pt presents with sore throat, likely post-nasal discharge. No signs of infectious etiology. Pt afebrile. Pt with negative strep. No abx indicated at this time. Discussed that results of strep culture are pending and patient will be informed if positive result and abx will be called in at that time. Pt here requesting a work note. No need for further evaluation or management in the ED. Pt will be discharged home with claritin and flonase. Pt advised use of symptomatic therapy. This option was discussed with the patient who agrees that this would be the next best option. Pt is instructed to follow up with her PCP if her symptoms persists.  Pt verbalized understanding and agreement to today's plan and had no further questions or concerns at this time.   Final Clinical Impressions(s) / ED Diagnoses     Final diagnoses:  Sore throat    New Prescriptions Discharge Medication List as of 01/08/2017  2:37 PM    START taking these medications   Details  fluticasone (FLONASE) 50 MCG/ACT nasal spray Place 1 spray into both nostrils daily., Starting Tue 01/08/2017, Print       I personally performed the services described in this documentation, which was scribed in my presence. The recorded information has been reviewed and is accurate.   Eyvonne Mechanic, PA-C 01/08/17 1806    Raeford Razor, MD 01/09/17 [phone]

## 2017-01-08 NOTE — Discharge Instructions (Signed)
Please read attached information. If you experience any new or worsening signs or symptoms please return to the emergency room for evaluation. Please follow-up with your primary care provider or specialist as discussed. Please use medication prescribed only as directed and discontinue taking if you have any concerning signs or symptoms.   °

## 2017-01-08 NOTE — ED Notes (Signed)
Pt is in stable condition upon d/c and ambulates from ED. 

## 2017-01-08 NOTE — ED Triage Notes (Signed)
Pt. Stated, I have a sore throat started last night with drainage.

## 2017-01-10 LAB — CULTURE, GROUP A STREP (THRC)

## 2017-01-22 ENCOUNTER — Other Ambulatory Visit: Payer: Self-pay | Admitting: Family

## 2017-02-05 ENCOUNTER — Inpatient Hospital Stay (HOSPITAL_COMMUNITY)
Admission: AD | Admit: 2017-02-05 | Discharge: 2017-02-05 | Disposition: A | Discharge status: Home / Self Care | Payer: BLUE CROSS/BLUE SHIELD | Source: Ambulatory Visit | Attending: Family Medicine | Admitting: Family Medicine

## 2017-02-05 DIAGNOSIS — L293 Anogenital pruritus, unspecified: Secondary | ICD-10-CM | POA: Diagnosis present

## 2017-02-05 DIAGNOSIS — Z87891 Personal history of nicotine dependence: Secondary | ICD-10-CM | POA: Diagnosis not present

## 2017-02-05 DIAGNOSIS — Z3202 Encounter for pregnancy test, result negative: Secondary | ICD-10-CM | POA: Insufficient documentation

## 2017-02-05 DIAGNOSIS — B373 Candidiasis of vulva and vagina: Secondary | ICD-10-CM | POA: Diagnosis not present

## 2017-02-05 DIAGNOSIS — B3731 Acute candidiasis of vulva and vagina: Secondary | ICD-10-CM

## 2017-02-05 LAB — URINALYSIS, ROUTINE W REFLEX MICROSCOPIC
BILIRUBIN URINE: NEGATIVE
GLUCOSE, UA: NEGATIVE mg/dL
HGB URINE DIPSTICK: NEGATIVE
KETONES UR: NEGATIVE mg/dL
NITRITE: NEGATIVE
PH: 5 (ref 5.0–8.0)
Protein, ur: 30 mg/dL — AB
SPECIFIC GRAVITY, URINE: 1.03 (ref 1.005–1.030)

## 2017-02-05 LAB — WET PREP, GENITAL
SPERM: NONE SEEN
Trich, Wet Prep: NONE SEEN

## 2017-02-05 LAB — POCT PREGNANCY, URINE: Preg Test, Ur: NEGATIVE

## 2017-02-05 MED ORDER — FLUCONAZOLE 150 MG PO TABS: 150.0000 mg | ORAL_TABLET | Freq: Once | ORAL | 0 refills | Status: DC

## 2017-02-05 MED ORDER — MICONAZOLE NITRATE 2 % EX CREA: 1.0000 "application " | TOPICAL_CREAM | Freq: Two times a day (BID) | CUTANEOUS | 0 refills | Status: DC

## 2017-02-05 MED ORDER — FLUCONAZOLE 150 MG PO TABS: 150.0000 mg | ORAL_TABLET | Freq: Once | ORAL | 0 refills | Status: AC

## 2017-02-05 NOTE — MAU Note (Signed)
Pt reports vaginal irritation for 2 days, states area is swollen and itching. Denies discharge.

## 2017-02-05 NOTE — Progress Notes (Signed)
Pt signed paper discharge instructions.  Put with chart.  Esignature pad not working in room.

## 2017-02-05 NOTE — MAU Provider Note (Signed)
History     CSN: [id number]  Arrival date and time: 02/05/17 1712   First Provider Initiated Contact with Patient 02/05/17 1828      Chief Complaint  Patient presents with  . Vaginal Itching   HPI   Ms.Carolyn Cortez is a 31 y.o. female G0P0000 here in MAU with vaginitis. States she has had vaginal itching for a few days. Denies discharge. States she has never had a yeast infection before.   OB History    Gravida Para Term Preterm AB Living   0 0 0 0 0 0   SAB TAB Ectopic Multiple Live Births   0 0 0 0        Past Medical History:  Diagnosis Date  . Abnormal Pap smear 06/22/2010  . Anemia   . Chlamydia   . Morbid obesity (HCC)   . PCOS (polycystic ovarian syndrome)   . Vaginal bleeding, abnormal   . Vaginal Pap smear, abnormal     Past Surgical History:  Procedure Laterality Date  . COLPOSCOPY  07/28/2010    No family history on file.  Social History  Substance Use Topics  . Smoking status: Former Games developer  . Smokeless tobacco: Never Used  . Alcohol use No    Allergies: No Known Allergies  Prescriptions Prior to Admission  Medication Sig Dispense Refill Last Dose  . fluticasone (FLONASE) 50 MCG/ACT nasal spray Place 1 spray into both nostrils daily. 9.9 g 2   . ibuprofen (ADVIL,MOTRIN) 200 MG tablet Take 400 mg by mouth every 6 (six) hours as needed for moderate pain.    07/11/2015 at Unknown time  . metFORMIN (GLUCOPHAGE) 500 MG tablet TAKE ONE TABLET BY MOUTH TWICE DAILY WITH A MEAL 60 tablet 11    Results for orders placed or performed during the hospital encounter of 02/05/17 (from the past 48 hour(s))  Urinalysis, Routine w reflex microscopic     Status: Abnormal   Collection Time: 02/05/17  5:33 PM  Result Value Ref Range   Color, Urine YELLOW YELLOW   APPearance CLOUDY (A) CLEAR   Specific Gravity, Urine 1.030 1.005 - 1.030   pH 5.0 5.0 - 8.0   Glucose, UA NEGATIVE NEGATIVE mg/dL   Hgb urine dipstick NEGATIVE NEGATIVE   Bilirubin Urine NEGATIVE  NEGATIVE   Ketones, ur NEGATIVE NEGATIVE mg/dL   Protein, ur 30 (A) NEGATIVE mg/dL   Nitrite NEGATIVE NEGATIVE   Leukocytes, UA LARGE (A) NEGATIVE   RBC / HPF 6-30 0 - 5 RBC/hpf   WBC, UA 6-30 0 - 5 WBC/hpf   Bacteria, UA RARE (A) NONE SEEN   Squamous Epithelial / LPF 6-30 (A) NONE SEEN   Mucous PRESENT   Pregnancy, urine POC     Status: None   Collection Time: 02/05/17  5:48 PM  Result Value Ref Range   Preg Test, Ur NEGATIVE NEGATIVE    Comment:        THE SENSITIVITY OF THIS METHODOLOGY IS >24 mIU/mL   Wet prep, genital     Status: Abnormal   Collection Time: 02/05/17  6:39 PM  Result Value Ref Range   Yeast Wet Prep HPF POC PRESENT (A) NONE SEEN   Trich, Wet Prep NONE SEEN NONE SEEN   Clue Cells Wet Prep HPF POC PRESENT (A) NONE SEEN   WBC, Wet Prep HPF POC MANY (A) NONE SEEN    Comment: BACTERIA- TOO NUMEROUS TO COUNT   Sperm NONE SEEN     Review of Systems  Constitutional: Negative for fever.  Gastrointestinal: Negative for abdominal pain.  Genitourinary: Negative for vaginal bleeding and vaginal discharge.   Physical Exam   Blood pressure (!) 158/96, pulse 99, temperature 98.7 F (37.1 C), temperature source Oral, resp. rate 18, height 5\' 2"  (1.575 m), weight 229 lb (103.9 kg), last menstrual period 01/15/2017, SpO2 100 %.  Physical Exam  Constitutional: She is oriented to person, place, and time. She appears well-developed and well-nourished. No distress.  HENT:  Head: Normocephalic.  Genitourinary:  Genitourinary Comments: Wet prep and GC collected without speculum Thick, white, clumpy discharge noted on external genitalia.   Musculoskeletal: Normal range of motion.  Neurological: She is alert and oriented to person, place, and time.  Skin: Skin is warm. She is not diaphoretic.  Psychiatric: Her behavior is normal.   MAU Course  Procedures  None  MDM  Wet prep & GC collected  Assessment and Plan   A:  1. Yeast vaginitis     P:  Discharge  home in stable condition Rx: Diflucan, repeat in 3 days. Miconazole cream for external use.  Return to MAU as needed if symptoms worsen    Rasch, Harolyn RutherfordJennifer I, NP 02/05/2017 7:36 PM

## 2017-02-05 NOTE — Discharge Instructions (Signed)

## 2017-02-06 LAB — GC/CHLAMYDIA PROBE AMP (~~LOC~~) NOT AT ARMC
CHLAMYDIA, DNA PROBE: NEGATIVE
Neisseria Gonorrhea: NEGATIVE

## 2017-02-12 DIAGNOSIS — E282 Polycystic ovarian syndrome: Secondary | ICD-10-CM | POA: Insufficient documentation

## 2017-03-19 DIAGNOSIS — I10 Essential (primary) hypertension: Secondary | ICD-10-CM | POA: Insufficient documentation

## 2017-06-16 ENCOUNTER — Inpatient Hospital Stay (HOSPITAL_COMMUNITY)
Admission: AD | Admit: 2017-06-16 | Discharge: 2017-06-16 | Disposition: A | Discharge status: Home / Self Care | Payer: BLUE CROSS/BLUE SHIELD | Source: Ambulatory Visit | Attending: Obstetrics and Gynecology | Admitting: Obstetrics and Gynecology

## 2017-06-16 ENCOUNTER — Encounter (HOSPITAL_COMMUNITY): Payer: Self-pay | Admitting: *Deleted

## 2017-06-16 DIAGNOSIS — N920 Excessive and frequent menstruation with regular cycle: Secondary | ICD-10-CM | POA: Diagnosis not present

## 2017-06-16 DIAGNOSIS — Z7984 Long term (current) use of oral hypoglycemic drugs: Secondary | ICD-10-CM | POA: Diagnosis not present

## 2017-06-16 DIAGNOSIS — Z3202 Encounter for pregnancy test, result negative: Secondary | ICD-10-CM | POA: Insufficient documentation

## 2017-06-16 DIAGNOSIS — N939 Abnormal uterine and vaginal bleeding, unspecified: Secondary | ICD-10-CM

## 2017-06-16 DIAGNOSIS — Z87891 Personal history of nicotine dependence: Secondary | ICD-10-CM | POA: Insufficient documentation

## 2017-06-16 DIAGNOSIS — E282 Polycystic ovarian syndrome: Secondary | ICD-10-CM | POA: Diagnosis not present

## 2017-06-16 LAB — CBC
HCT: 29.2 % — ABNORMAL LOW (ref 36.0–46.0)
Hemoglobin: 9.7 g/dL — ABNORMAL LOW (ref 12.0–15.0)
MCH: 21.3 pg — ABNORMAL LOW (ref 26.0–34.0)
MCHC: 33.2 g/dL (ref 30.0–36.0)
MCV: 64 fL — AB (ref 78.0–100.0)
PLATELETS: 387 10*3/uL (ref 150–400)
RBC: 4.56 MIL/uL (ref 3.87–5.11)
RDW: 19 % — ABNORMAL HIGH (ref 11.5–15.5)
WBC: 8.6 10*3/uL (ref 4.0–10.5)

## 2017-06-16 LAB — WET PREP, GENITAL
Sperm: NONE SEEN
Trich, Wet Prep: NONE SEEN
YEAST WET PREP: NONE SEEN

## 2017-06-16 LAB — URINALYSIS, ROUTINE W REFLEX MICROSCOPIC
BACTERIA UA: NONE SEEN
Bilirubin Urine: NEGATIVE
Glucose, UA: NEGATIVE mg/dL
Ketones, ur: 5 mg/dL — AB
Leukocytes, UA: NEGATIVE
NITRITE: NEGATIVE
PH: 5 (ref 5.0–8.0)
Protein, ur: NEGATIVE mg/dL
Specific Gravity, Urine: 1.026 (ref 1.005–1.030)

## 2017-06-16 LAB — POCT PREGNANCY, URINE: PREG TEST UR: NEGATIVE

## 2017-06-16 MED ORDER — NORGESTIMATE-ETH ESTRADIOL 0.25-35 MG-MCG PO TABS: 2.0000 | ORAL_TABLET | Freq: Every day | ORAL | 0 refills | Status: DC

## 2017-06-16 NOTE — MAU Provider Note (Signed)
History     CSN: [id number]  Arrival date and time: 06/16/17 21300017   First Provider Initiated Contact with Patient 06/16/17 [id number]      Chief Complaint  Patient presents with  . Vaginal Bleeding   G2 non-pregnant female here with prolonged menses. Been bleeding since 05/05/17. Changing pads about 3 times a day. Passing small clots. No cramping or pain. No new partner. Uses condoms. H/o PCOS. Has similar bleeding pattern 2 years ago. Took OCPs but stopped d/t weight gain. Having gastric sleeve surgery in 2 weeks. Hx of Chlamydia in past, was treated.   Past Medical History:  Diagnosis Date  . Abnormal Pap smear 06/22/2010  . Anemia   . Chlamydia   . Morbid obesity (HCC)   . PCOS (polycystic ovarian syndrome)   . Vaginal bleeding, abnormal   . Vaginal Pap smear, abnormal     Past Surgical History:  Procedure Laterality Date  . COLPOSCOPY  07/28/2010    History reviewed. No pertinent family history.  Social History   Tobacco Use  . Smoking status: Former Games developermoker  . Smokeless tobacco: Never Used  Substance Use Topics  . Alcohol use: No  . Drug use: No    Allergies: No Known Allergies  Medications Prior to Admission  Medication Sig Dispense Refill Last Dose  . fluticasone (FLONASE) 50 MCG/ACT nasal spray Place 1 spray into both nostrils daily. 9.9 g 2   . ibuprofen (ADVIL,MOTRIN) 200 MG tablet Take 400 mg by mouth every 6 (six) hours as needed for moderate pain.    07/11/2015 at Unknown time  . metFORMIN (GLUCOPHAGE) 500 MG tablet TAKE ONE TABLET BY MOUTH TWICE DAILY WITH A MEAL 60 tablet 11   . miconazole (MICOTIN) 2 % cream Apply 1 application topically 2 (two) times daily. 28.35 g 0     Review of Systems  Gastrointestinal: Negative for abdominal pain.  Genitourinary: Positive for vaginal bleeding. Negative for pelvic pain.   Physical Exam   Blood pressure (!) 140/92, pulse 99, temperature 98.4 F (36.9 C), resp. rate 18, height 5\' 2"  (1.575 m), weight 234 lb  (106.1 kg), last menstrual period 05/05/2017.  Physical Exam  Nursing note and vitals reviewed. Constitutional: She is oriented to person, place, and time. She appears well-developed and well-nourished.  HENT:  Head: Normocephalic and atraumatic.  Neck: Normal range of motion.  Respiratory: Effort normal. No respiratory distress.  Genitourinary:  Genitourinary Comments: External: no lesions or erythema Vagina: rugated, pink, moist, mod drk red bloody discharge Uterus/Adnexae: difficult d/t body habitus, no CMT  Musculoskeletal: Normal range of motion.  Neurological: She is alert and oriented to person, place, and time.  Skin: Skin is warm and dry.  Psychiatric: She has a normal mood and affect.   Results for orders placed or performed during the hospital encounter of 06/16/17 (from the past 24 hour(s))  Urinalysis, Routine w reflex microscopic     Status: Abnormal   Collection Time: 06/16/17 12:35 AM  Result Value Ref Range   Color, Urine YELLOW YELLOW   APPearance CLEAR CLEAR   Specific Gravity, Urine 1.026 1.005 - 1.030   pH 5.0 5.0 - 8.0   Glucose, UA NEGATIVE NEGATIVE mg/dL   Hgb urine dipstick LARGE (A) NEGATIVE   Bilirubin Urine NEGATIVE NEGATIVE   Ketones, ur 5 (A) NEGATIVE mg/dL   Protein, ur NEGATIVE NEGATIVE mg/dL   Nitrite NEGATIVE NEGATIVE   Leukocytes, UA NEGATIVE NEGATIVE   RBC / HPF TOO NUMEROUS TO COUNT 0 -  5 RBC/hpf   WBC, UA 0-5 0 - 5 WBC/hpf   Bacteria, UA NONE SEEN NONE SEEN   Squamous Epithelial / LPF 0-5 (A) NONE SEEN   Mucus PRESENT   Pregnancy, urine POC     Status: None   Collection Time: 06/16/17 12:54 AM  Result Value Ref Range   Preg Test, Ur NEGATIVE NEGATIVE  CBC     Status: Abnormal   Collection Time: 06/16/17  1:10 AM  Result Value Ref Range   WBC 8.6 4.0 - 10.5 K/uL   RBC 4.56 3.87 - 5.11 MIL/uL   Hemoglobin 9.7 (L) 12.0 - 15.0 g/dL   HCT 16.129.2 (L) 09.636.0 - 04.546.0 %   MCV 64.0 (L) 78.0 - 100.0 fL   MCH 21.3 (L) 26.0 - 34.0 pg   MCHC 33.2  30.0 - 36.0 g/dL   RDW 40.919.0 (H) 81.111.5 - 91.415.5 %   Platelets 387 150 - 400 K/uL  Wet prep, genital     Status: Abnormal   Collection Time: 06/16/17  1:10 AM  Result Value Ref Range   Yeast Wet Prep HPF POC NONE SEEN NONE SEEN   Trich, Wet Prep NONE SEEN NONE SEEN   Clue Cells Wet Prep HPF POC PRESENT (A) NONE SEEN   WBC, Wet Prep HPF POC FEW (A) NONE SEEN   Sperm NONE SEEN    MAU Course  Procedures  MDM Labs ordered and reviewed. No evidence of pregnancy. She's taking Fe daily for known anemia, should continue. Will use OCP taper to control bleeding. F/u in [facility], consider pelvic US. Stable for discharge home.  Assessment and Plan   1. Abnormal uterine bleeding (AUB)    [facility] Sprintec  Allergies as of 06/16/2017   No Known Allergies     Medication List    TAKE these medications   fluticasone 50 MCG/ACT nasal spray Commonly known as:  FLONASE Place 1 spray into both nostrils daily.   ibuprofen 200 MG tablet Commonly known as:  ADVIL,MOTRIN Take 400 mg by mouth every 6 (six) hours as needed for moderate pain.   metFORMIN 500 MG tablet Commonly known as:  GLUCOPHAGE TAKE ONE TABLET BY MOUTH TWICE DAILY WITH A MEAL   miconazole 2 % cream Commonly known as:  MICOTIN Apply 1 application topically 2 (two) times daily.   norgestimate-ethinyl estradiol 0.25-35 MG-MCG tablet Commonly known as:  ORTHO-CYCLEN,SPRINTEC,PREVIFEM Take 2 tablets by mouth daily. Take 2 tabs daily until bleeding stops then 1 tab daily      Donette LarryMelanie Dustine Stickler, CNM 06/16/2017, 2:07 AM

## 2017-06-16 NOTE — MAU Note (Signed)
Pt reports she has been bleeding since the second week of November. Changing pas about 3 times a day. Passing some clots. Denies any pain or cramping. Pt has history of PCOS and had had bleeding like hs in the past.

## 2017-06-17 LAB — GC/CHLAMYDIA PROBE AMP (~~LOC~~) NOT AT ARMC
Chlamydia: NEGATIVE
Neisseria Gonorrhea: NEGATIVE

## 2017-11-12 ENCOUNTER — Emergency Department (HOSPITAL_COMMUNITY)
Admission: EM | Admit: 2017-11-12 | Discharge: 2017-11-12 | Disposition: A | Discharge status: Home / Self Care | Payer: BLUE CROSS/BLUE SHIELD | Attending: Emergency Medicine | Admitting: Emergency Medicine

## 2017-11-12 ENCOUNTER — Encounter (HOSPITAL_COMMUNITY): Payer: Self-pay | Admitting: Emergency Medicine

## 2017-11-12 DIAGNOSIS — H5711 Ocular pain, right eye: Secondary | ICD-10-CM

## 2017-11-12 DIAGNOSIS — Z87891 Personal history of nicotine dependence: Secondary | ICD-10-CM | POA: Insufficient documentation

## 2017-11-12 DIAGNOSIS — Z79899 Other long term (current) drug therapy: Secondary | ICD-10-CM | POA: Insufficient documentation

## 2017-11-12 DIAGNOSIS — Z7984 Long term (current) use of oral hypoglycemic drugs: Secondary | ICD-10-CM | POA: Insufficient documentation

## 2017-11-12 MED ORDER — TETRACAINE HCL 0.5 % OP SOLN
1.0000 [drp] | Freq: Once | OPHTHALMIC | Status: AC
  Administered 2017-11-12: 1 [drp] via OPHTHALMIC
  Filled 2017-11-12: qty 4

## 2017-11-12 MED ORDER — HYDROCODONE-ACETAMINOPHEN 5-325 MG PO TABS
1.0000 | ORAL_TABLET | Freq: Once | ORAL | Status: AC
  Administered 2017-11-12: 1 via ORAL
  Filled 2017-11-12: qty 1

## 2017-11-12 MED ORDER — FLUORESCEIN SODIUM 1 MG OP STRP
1.0000 | ORAL_STRIP | Freq: Once | OPHTHALMIC | Status: AC
  Administered 2017-11-12: 1 via OPHTHALMIC
  Filled 2017-11-12: qty 1

## 2017-11-12 NOTE — ED Triage Notes (Addendum)
Patient reports right eye pain since this morning. Reports clear drainage. Patient reports pain increases with light. Patient unable to open eye for assessment in triage because "there is too much light." Denies trauma.

## 2017-11-12 NOTE — ED Notes (Signed)
EDP and NP at bedside

## 2017-11-12 NOTE — Discharge Instructions (Addendum)
Go to [facility] now to see Dr. Sherryll Burger.

## 2017-11-12 NOTE — ED Provider Notes (Signed)
[facility]-EMERGENCY DEPT Provider Note   CSN: [id number] Arrival date & time: 11/12/17  1105     History   Chief Complaint Chief Complaint  Patient presents with  . Eye Pain    HPI RICKELLE Carolyn Cortez is a 32 y.o. female who presets to the ED with right eye pain and irritation that started this morning. Patient reports clear drainage. She does have sensitivity to light. No know trauma to the eye. Patient reports pain is severe. Patient does not wear glasses or contact lenses.   HPI  Past Medical History:  Diagnosis Date  . Abnormal Pap smear 06/22/2010  . Anemia   . Chlamydia   . Morbid obesity (HCC)   . PCOS (polycystic ovarian syndrome)   . Vaginal bleeding, abnormal   . Vaginal Pap smear, abnormal     Patient Active Problem List   Diagnosis Date Noted  . History of chlamydia 04/09/2011  . PCOS (polycystic ovarian syndrome)   . History of abnormal cervical Pap smear 06/22/2010    Past Surgical History:  Procedure Laterality Date  . COLPOSCOPY  07/28/2010     OB History    Gravida  0   Para  0   Term  0   Preterm  0   AB  0   Living  0     SAB  0   TAB  0   Ectopic  0   Multiple  0   Live Births               Home Medications    Prior to Admission medications   Medication Sig Start Date End Date Taking? Authorizing Provider  fluticasone (FLONASE) 50 MCG/ACT nasal spray Place 1 spray into both nostrils daily. 01/08/17  Yes Hedges, Tinnie Gens, PA-C  ibuprofen (ADVIL,MOTRIN) 200 MG tablet Take 400 mg by mouth every 6 (six) hours as needed for moderate pain.    Yes [provider]  metFORMIN (GLUCOPHAGE) 500 MG tablet TAKE ONE TABLET BY MOUTH TWICE DAILY WITH A MEAL 01/25/17  Yes Elenora Fender, Walidah N, CNM  miconazole (MICOTIN) 2 % cream Apply 1 application topically 2 (two) times daily. Patient not taking: Reported on 11/12/2017 02/05/17   Rasch, Victorino Dike I, NP  norgestimate-ethinyl estradiol  (ORTHO-CYCLEN,SPRINTEC,PREVIFEM) 0.25-35 MG-MCG tablet Take 2 tablets by mouth daily. Take 2 tabs daily until bleeding stops then 1 tab daily Patient not taking: Reported on 11/12/2017 06/16/17   Donette Larry, CNM    Family History No family history on file.  Social History Social History   Tobacco Use  . Smoking status: Former Games developer  . Smokeless tobacco: Never Used  Substance Use Topics  . Alcohol use: No  . Drug use: No     Allergies   Patient has no known allergies.   Review of Systems Review of Systems  Eyes: Positive for pain (right).  All other systems reviewed and are negative.    Physical Exam Updated Vital Signs BP (!) 152/93 (BP Location: Right Arm)   Pulse 60   Temp 98.5 F (36.9 C) (Oral)   Resp 16   LMP 10/19/2017   SpO2 100%   Physical Exam  Constitutional: She appears well-developed and well-nourished. No distress.  HENT:  Head: Normocephalic.  Eyes: EOM are normal.  Tetracaine Opth drops to right eye and patient reports minimal relief of pain. Patient refuses to open her eye for exam.  Neck: Neck supple.  Cardiovascular: Normal rate.  Pulmonary/Chest: Effort normal.  Musculoskeletal:  Normal range of motion.  Neurological: She is alert.  Skin: Skin is warm and dry.  Nursing note and vitals reviewed.    ED Treatments / Results  Labs (all labs ordered are listed, but only abnormal results are displayed) Labs Reviewed - No data to display  Radiology No results found.  Procedures Procedures (including critical care time)  Medications Ordered in ED Medications  tetracaine (PONTOCAINE) 0.5 % ophthalmic solution 1 drop (1 drop Right Eye Given 11/12/17 1324)  fluorescein ophthalmic strip 1 strip (1 strip Right Eye Given 11/12/17 1323)  HYDROcodone-acetaminophen (NORCO/VICODIN) 5-325 MG per tablet 1 tablet (1 tablet Oral Given 11/12/17 1342)     Initial Impression / Assessment and Plan / ED Course  I have reviewed the triage vital  signs and the nursing notes. Dr. Juleen China in to examine the patient and she refused exam by him. Call to Dr. Margaretmary Eddy office, on call for ophthalmology  and they will see her now. Discussed with the patient and she has someone to take her there now. Patient appears stable for d/c to f/u in the office of Dr. Sherryll Burger.   Final Clinical Impressions(s) / ED Diagnoses   Final diagnoses:  Acute right eye pain    ED Discharge Orders    None       Kerrie Buffalo [place], [place] 11/12/17 1558    Raeford Razor, MD 11/13/17 1238

## 2018-08-26 ENCOUNTER — Encounter (HOSPITAL_COMMUNITY): Payer: Self-pay | Admitting: *Deleted

## 2018-08-26 ENCOUNTER — Emergency Department (HOSPITAL_COMMUNITY)
Admission: EM | Admit: 2018-08-26 | Discharge: 2018-08-26 | Disposition: A | Discharge status: Home / Self Care | Payer: Self-pay | Attending: Emergency Medicine | Admitting: Emergency Medicine

## 2018-08-26 ENCOUNTER — Other Ambulatory Visit: Payer: Self-pay

## 2018-08-26 DIAGNOSIS — Z87891 Personal history of nicotine dependence: Secondary | ICD-10-CM | POA: Insufficient documentation

## 2018-08-26 DIAGNOSIS — Z79899 Other long term (current) drug therapy: Secondary | ICD-10-CM | POA: Insufficient documentation

## 2018-08-26 DIAGNOSIS — B9789 Other viral agents as the cause of diseases classified elsewhere: Secondary | ICD-10-CM

## 2018-08-26 DIAGNOSIS — J069 Acute upper respiratory infection, unspecified: Secondary | ICD-10-CM | POA: Insufficient documentation

## 2018-08-26 DIAGNOSIS — I10 Essential (primary) hypertension: Secondary | ICD-10-CM | POA: Insufficient documentation

## 2018-08-26 MED ORDER — BENZONATATE 100 MG PO CAPS: 100.0000 mg | ORAL_CAPSULE | Freq: Three times a day (TID) | ORAL | 0 refills | Status: AC

## 2018-08-26 NOTE — ED Triage Notes (Signed)
PT reports 2 days of URI Sx's  With a dry cough.

## 2018-08-26 NOTE — Discharge Instructions (Signed)

## 2018-08-26 NOTE — ED Provider Notes (Signed)
[facility] EMERGENCY DEPARTMENT Provider Note   CSN: [id number] Arrival date & time: 08/26/18  2637    History   Chief Complaint Chief Complaint  Patient presents with  . Cough    HPI Carolyn Cortez is a 33 y.o. female.     HPI   Pt is a 33 y/o female wit ha h/o anemia, obesity, pcos, who presents to the ED today c/o a dry cough, rhinorrhea, nasal congestion for the last 2 days. Denies significant sore throat, fevers, chills, or sweats. Intermittent sob, mostly at night when she has coughing fits. Chest pain present only with cough. No NVD or urinary sxs.   States she used to have dx of htn but no longer takes meds. Does have pcp.  Past Medical History:  Diagnosis Date  . Abnormal Pap smear 06/22/2010  . Anemia   . Chlamydia   . Morbid obesity (HCC)   . PCOS (polycystic ovarian syndrome)   . Vaginal bleeding, abnormal   . Vaginal Pap smear, abnormal     Patient Active Problem List   Diagnosis Date Noted  . History of chlamydia 04/09/2011  . PCOS (polycystic ovarian syndrome)   . History of abnormal cervical Pap smear 06/22/2010    Past Surgical History:  Procedure Laterality Date  . COLPOSCOPY  07/28/2010     OB History    Gravida  0   Para  0   Term  0   Preterm  0   AB  0   Living  0     SAB  0   TAB  0   Ectopic  0   Multiple  0   Live Births               Home Medications    Prior to Admission medications   Medication Sig Start Date End Date Taking? Authorizing Provider  benzonatate (TESSALON) 100 MG capsule Take 1 capsule (100 mg total) by mouth every 8 (eight) hours for 5 days. 08/26/18 08/31/18  Kathan Kirker S, PA-C  fluticasone (FLONASE) 50 MCG/ACT nasal spray Place 1 spray into both nostrils daily. 01/08/17   Hedges, Tinnie Gens, PA-C  ibuprofen (ADVIL,MOTRIN) 200 MG tablet Take 400 mg by mouth every 6 (six) hours as needed for moderate pain.     [provider]  metFORMIN (GLUCOPHAGE) 500 MG tablet  TAKE ONE TABLET BY MOUTH TWICE DAILY WITH A MEAL 01/25/17   Karim-Rhoades, Walidah N, CNM  miconazole (MICOTIN) 2 % cream Apply 1 application topically 2 (two) times daily. Patient not taking: Reported on 11/12/2017 02/05/17   Rasch, Victorino Dike I, NP  norgestimate-ethinyl estradiol (ORTHO-CYCLEN,SPRINTEC,PREVIFEM) 0.25-35 MG-MCG tablet Take 2 tablets by mouth daily. Take 2 tabs daily until bleeding stops then 1 tab daily Patient not taking: Reported on 11/12/2017 06/16/17   Donette Larry, CNM    Family History History reviewed. No pertinent family history.  Social History Social History   Tobacco Use  . Smoking status: Former Games developer  . Smokeless tobacco: Never Used  Substance Use Topics  . Alcohol use: No  . Drug use: No     Allergies   Patient has no known allergies.   Review of Systems Review of Systems  Constitutional: Negative for chills and fever.  HENT: Positive for congestion and rhinorrhea. Negative for ear pain and sore throat.   Eyes: Negative for pain and visual disturbance.  Respiratory: Positive for cough and shortness of breath (intermittent, none currently).   Cardiovascular: Positive for  chest pain (with cough only).  Gastrointestinal: Negative for abdominal pain and vomiting.  Genitourinary: Negative for flank pain.  Musculoskeletal: Negative for myalgias.  Skin: Negative for rash.  Neurological: Negative for headaches.  All other systems reviewed and are negative.    Physical Exam Updated Vital Signs BP (!) 157/108 (BP Location: Right Arm)   Pulse 88   Temp 98.3 F (36.8 C) (Oral)   Resp 18   Ht 5\' 2"  (1.575 m)   Wt 69.4 kg   LMP 08/22/2018   SpO2 100%   BMI 27.98 kg/m   Physical Exam Vitals signs and nursing note reviewed.  Constitutional:      General: She is not in acute distress.    Appearance: She is well-developed.  HENT:     Head: Normocephalic and atraumatic.     Right Ear: Tympanic membrane normal.     Left Ear: Tympanic membrane  normal.     Nose: Nose normal.     Mouth/Throat:     Mouth: Mucous membranes are moist.     Pharynx: No oropharyngeal exudate or posterior oropharyngeal erythema.     Comments: No tonsilar edema or exudate Eyes:     Conjunctiva/sclera: Conjunctivae normal.  Neck:     Musculoskeletal: Neck supple.  Cardiovascular:     Rate and Rhythm: Normal rate and regular rhythm.     Heart sounds: Normal heart sounds. No murmur.  Pulmonary:     Effort: Pulmonary effort is normal. No respiratory distress.     Breath sounds: Normal breath sounds. No stridor. No wheezing or rhonchi.  Abdominal:     General: Bowel sounds are normal.     Palpations: Abdomen is soft.     Tenderness: There is no abdominal tenderness.  Lymphadenopathy:     Cervical: No cervical adenopathy.  Skin:    General: Skin is warm and dry.  Neurological:     Mental Status: She is alert.    ED Treatments / Results  Labs (all labs ordered are listed, but only abnormal results are displayed) Labs Reviewed - No data to display  EKG None  Radiology No results found.  Procedures Procedures (including critical care time)  Medications Ordered in ED Medications - No data to display   Initial Impression / Assessment and Plan / ED Course  I have reviewed the triage vital signs and the nursing notes.  Pertinent labs & imaging results that were available during my care of the patient were reviewed by me and considered in my medical decision making (see chart for details).     Final Clinical Impressions(s) / ED Diagnoses   Final diagnoses:  Viral URI with cough  Hypertension, unspecified type    Patients symptoms are consistent with URI, likely viral etiology. Discussed that antibiotics are not indicated for viral infections. Pt will be discharged with symptomatic treatment.  Verbalizes understanding and is agreeable with plan. Pt is hemodynamically stable & in NAD prior to dc.  Patient's blood pressure elevated in the  emergency department today. Patient denies headache, change in vision, numbness, weakness, persistent chest pain, dyspnea, dizziness, or lightheadedness therefore doubt hypertensive emergency. Discussed elevated blood pressure with the patient and the need for primary care follow up with potential need to initiate or change antihypertensive medications and or for further evaluation. Discussed return precaution signs/symptoms for hypertensive emergency as listed above with the patient. He/she confirmed understanding.     ED Discharge Orders         Ordered  benzonatate (TESSALON) 100 MG capsule  Every 8 hours     08/26/18 0931           Karrie Meres, PA-C 08/26/18 0932    Vanetta Mulders, MD 08/27/18 [phone]

## 2018-08-26 NOTE — ED Notes (Signed)
Patient verbalized understanding of discharge instructions and denies any further needs or questions at this time. VS stable. Patient ambulatory with steady gait.  

## 2019-07-03 HISTORY — PX: BREAST REDUCTION SURGERY: SHX8

## 2019-09-24 ENCOUNTER — Other Ambulatory Visit: Payer: Self-pay | Admitting: Plastic Surgery

## 2020-05-26 ENCOUNTER — Other Ambulatory Visit: Payer: Self-pay

## 2020-05-26 ENCOUNTER — Emergency Department (HOSPITAL_COMMUNITY)
Admission: EM | Admit: 2020-05-26 | Discharge: 2020-05-26 | Disposition: A | Discharge status: Home / Self Care | Payer: BC Managed Care – PPO | Attending: Emergency Medicine | Admitting: Emergency Medicine

## 2020-05-26 ENCOUNTER — Emergency Department (HOSPITAL_COMMUNITY): Payer: BC Managed Care – PPO

## 2020-05-26 ENCOUNTER — Encounter (HOSPITAL_COMMUNITY): Payer: Self-pay | Admitting: *Deleted

## 2020-05-26 DIAGNOSIS — H1131 Conjunctival hemorrhage, right eye: Secondary | ICD-10-CM | POA: Diagnosis not present

## 2020-05-26 DIAGNOSIS — S0083XA Contusion of other part of head, initial encounter: Secondary | ICD-10-CM | POA: Diagnosis not present

## 2020-05-26 DIAGNOSIS — S0993XA Unspecified injury of face, initial encounter: Secondary | ICD-10-CM | POA: Diagnosis present

## 2020-05-26 DIAGNOSIS — R55 Syncope and collapse: Secondary | ICD-10-CM | POA: Insufficient documentation

## 2020-05-26 DIAGNOSIS — Z87891 Personal history of nicotine dependence: Secondary | ICD-10-CM | POA: Insufficient documentation

## 2020-05-26 MED ORDER — OXYCODONE-ACETAMINOPHEN 5-325 MG PO TABS
1.0000 | ORAL_TABLET | Freq: Once | ORAL | Status: AC
  Administered 2020-05-26: 1 via ORAL
  Filled 2020-05-26: qty 1

## 2020-05-26 MED ORDER — OXYCODONE HCL 5 MG PO TABS: 5.0000 mg | ORAL_TABLET | Freq: Four times a day (QID) | ORAL | 0 refills | Status: DC | PRN
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
## 2020-05-26 NOTE — ED Notes (Signed)
Patient verbalizes understanding of discharge instructions. Opportunity for questioning and answers were provided. Armband removed by staff, pt discharged from ED and ambulated to lobby to return home with family.  
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
## 2020-08-14 ENCOUNTER — Other Ambulatory Visit: Payer: Self-pay

## 2020-08-14 ENCOUNTER — Emergency Department (HOSPITAL_COMMUNITY)
Admission: EM | Admit: 2020-08-14 | Discharge: 2020-08-14 | Disposition: A | Discharge status: Home / Self Care | Payer: BC Managed Care – PPO | Attending: Emergency Medicine | Admitting: Emergency Medicine

## 2020-08-14 DIAGNOSIS — N6452 Nipple discharge: Secondary | ICD-10-CM | POA: Insufficient documentation

## 2020-08-14 DIAGNOSIS — Z87891 Personal history of nicotine dependence: Secondary | ICD-10-CM | POA: Insufficient documentation

## 2020-08-14 LAB — I-STAT BETA HCG BLOOD, ED (MC, WL, AP ONLY): I-stat hCG, quantitative: <5 m[IU]/mL (ref <5)

## 2020-08-14 MED ORDER — AMOXICILLIN-POT CLAVULANATE 875-125 MG PO TABS
1.0000 | ORAL_TABLET | Freq: Once | ORAL | Status: AC
  Administered 2020-08-14: 1 via ORAL
  Filled 2020-08-14: qty 1

## 2020-08-14 MED ORDER — ONDANSETRON 4 MG PO TBDP
4.0000 mg | ORAL_TABLET | Freq: Once | ORAL | Status: AC
  Administered 2020-08-14: 4 mg via ORAL
  Filled 2020-08-14: qty 1

## 2020-08-14 MED ORDER — AMOXICILLIN-POT CLAVULANATE 875-125 MG PO TABS: 1.0000 | ORAL_TABLET | Freq: Two times a day (BID) | ORAL | 0 refills | Status: DC

## 2020-08-14 MED ORDER — OXYCODONE-ACETAMINOPHEN 5-325 MG PO TABS
1.0000 | ORAL_TABLET | Freq: Once | ORAL | Status: AC
  Administered 2020-08-14: 1 via ORAL
  Filled 2020-08-14: qty 1

## 2020-08-14 NOTE — ED Notes (Signed)
Patient verbalized understanding of discharge instructions. Opportunity for questions and answers.  

## 2020-08-14 NOTE — ED Triage Notes (Addendum)
Pt presents to ED POV. Pt c/o drainage from breasts bilaterally x1w. Pt reports that she had breast reduction in March w/ no complications. No breast feeding, no pregnancies. Does report some warmth but no fevers. LMP 06/06/2020 - hx of irregular menstrual cycles

## 2020-08-14 NOTE — Discharge Instructions (Signed)
Take the prescribed medication as directed.  Can try warm compresses to the area to see if this helps. Follow-up with Dr. Odis Luster.  I would call his office first thing in the morning to get an appointment set up. Return to the ED for new or worsening symptoms.
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
## 2021-07-15 ENCOUNTER — Emergency Department (HOSPITAL_COMMUNITY)
Admission: EM | Admit: 2021-07-15 | Discharge: 2021-07-15 | Discharge status: Absent without leave | Payer: Self-pay | Source: Home / Self Care

## 2021-07-16 ENCOUNTER — Other Ambulatory Visit: Payer: Self-pay

## 2021-07-16 ENCOUNTER — Encounter (HOSPITAL_COMMUNITY): Payer: Self-pay

## 2021-07-16 ENCOUNTER — Ambulatory Visit (HOSPITAL_COMMUNITY)
Admission: EM | Admit: 2021-07-16 | Discharge: 2021-07-16 | Disposition: A | Discharge status: Home / Self Care | Payer: Self-pay | Attending: Physician Assistant | Admitting: Physician Assistant

## 2021-07-16 DIAGNOSIS — J101 Influenza due to other identified influenza virus with other respiratory manifestations: Secondary | ICD-10-CM

## 2021-07-16 DIAGNOSIS — R051 Acute cough: Secondary | ICD-10-CM

## 2021-07-16 DIAGNOSIS — R030 Elevated blood-pressure reading, without diagnosis of hypertension: Secondary | ICD-10-CM

## 2021-07-16 LAB — POC INFLUENZA A AND B ANTIGEN (URGENT CARE ONLY)
INFLUENZA A ANTIGEN, POC: POSITIVE — AB
INFLUENZA B ANTIGEN, POC: NEGATIVE

## 2021-07-16 MED ORDER — PROMETHAZINE-DM 6.25-15 MG/5ML PO SYRP: 5.0000 mL | ORAL_SOLUTION | Freq: Three times a day (TID) | ORAL | 0 refills | Status: DC | PRN

## 2021-07-16 MED ORDER — ACETAMINOPHEN 325 MG PO TABS
ORAL_TABLET | ORAL | Status: AC
  Filled 2021-07-16: qty 3

## 2021-07-16 MED ORDER — OSELTAMIVIR PHOSPHATE 75 MG PO CAPS: 75.0000 mg | ORAL_CAPSULE | Freq: Two times a day (BID) | ORAL | 0 refills | Status: DC

## 2021-07-16 MED ORDER — ACETAMINOPHEN 325 MG PO TABS
975.0000 mg | ORAL_TABLET | Freq: Once | ORAL | Status: AC
  Administered 2021-07-16: 975 mg via ORAL

## 2021-07-16 NOTE — ED Triage Notes (Signed)
Pt presents to the office for coughing, congestion,fever and body aches x 2 days.

## 2021-07-16 NOTE — ED Provider Notes (Signed)
[facility]    CSN: [id number] Arrival date & time: 07/16/21  1411      History   Chief Complaint Chief Complaint  Patient presents with   Cough    FEVER AND BODY ACHES      HPI Carolyn Cortez is a 36 y.o. female.   Patient presents today with a 2-day history of URI symptoms.  Reports fever, cough, congestion, body aches, nausea.  Denies any chest pain, shortness of breath, vomiting, dizziness.  She has not had any over-the-counter medication for symptom management.  She has a COVID-19 vaccine.  Has had COVID approximately 3 months ago.  She has not had flu shot.  Denies any known sick contacts.  Denies any recent antibiotic use.  Denies history of allergies or asthma.  She is a former smoker.  Blood pressure elevated today.  She denies history of essential hypertension.  Denies any recent decongestant use.  She denies any current chest pain, shortness of breath, headache, vision changes, dizziness.   Past Medical History:  Diagnosis Date   Abnormal Pap smear 06/22/2010   Anemia    Chlamydia    Morbid obesity (HCC)    PCOS (polycystic ovarian syndrome)    Vaginal bleeding, abnormal    Vaginal Pap smear, abnormal     Patient Active Problem List   Diagnosis Date Noted   History of chlamydia 04/09/2011   PCOS (polycystic ovarian syndrome)    History of abnormal cervical Pap smear 06/22/2010    Past Surgical History:  Procedure Laterality Date   COLPOSCOPY  07/28/2010    OB History     Gravida  0   Para  0   Term  0   Preterm  0   AB  0   Living  0      SAB  0   IAB  0   Ectopic  0   Multiple  0   Live Births               Home Medications    Prior to Admission medications   Medication Sig Start Date End Date Taking? Authorizing Provider  oseltamivir (TAMIFLU) 75 MG capsule Take 1 capsule (75 mg total) by mouth every 12 (twelve) hours. 07/16/21  Yes Brande Uncapher K, PA-C  promethazine-dextromethorphan (PROMETHAZINE-DM) 6.25-15  MG/5ML syrup Take 5 mLs by mouth 3 (three) times daily as needed for cough. 07/16/21  Yes Dannica Bickham K, PA-C  fluticasone (FLONASE) 50 MCG/ACT nasal spray Place 1 spray into both nostrils daily. 01/08/17   Hedges, Tinnie Gens, PA-C  ibuprofen (ADVIL,MOTRIN) 200 MG tablet Take 400 mg by mouth every 6 (six) hours as needed for moderate pain.     [provider]  metFORMIN (GLUCOPHAGE) 500 MG tablet TAKE ONE TABLET BY MOUTH TWICE DAILY WITH A MEAL 01/25/17   Karim-Rhoades, Walidah N, CNM  oxyCODONE (OXY IR/ROXICODONE) 5 MG immediate release tablet Take 1 tablet (5 mg total) by mouth every 6 (six) hours as needed for severe pain. 05/26/20   Renne Crigler, PA-C  norgestimate-ethinyl estradiol (ORTHO-CYCLEN,SPRINTEC,PREVIFEM) 0.25-35 MG-MCG tablet Take 2 tablets by mouth daily. Take 2 tabs daily until bleeding stops then 1 tab daily Patient not taking: Reported on 11/12/2017 06/16/17 05/26/20  Donette Larry, CNM    Family History History reviewed. No pertinent family history.  Social History Social History   Tobacco Use   Smoking status: Former   Smokeless tobacco: Never  Substance Use Topics   Alcohol use: No  Drug use: No     Allergies   Patient has no known allergies.   Review of Systems Review of Systems  Constitutional:  Positive for activity change, fatigue and fever. Negative for appetite change.  HENT:  Positive for congestion. Negative for sinus pressure, sneezing and sore throat.   Respiratory:  Positive for cough. Negative for shortness of breath.   Cardiovascular:  Negative for chest pain.  Gastrointestinal:  Positive for nausea. Negative for abdominal pain, diarrhea and vomiting.  Musculoskeletal:  Negative for arthralgias and myalgias.  Neurological:  Positive for headaches. Negative for dizziness and light-headedness.    Physical Exam Triage Vital Signs ED Triage Vitals [07/16/21 1421]  Enc Vitals Group     BP (!) 189/99     Pulse Rate 90     Resp 18      Temp (!) 101 F (38.3 C)     Temp Source Oral     SpO2 100 %     Weight      Height      Head Circumference      Peak Flow      Pain Score      Pain Loc      Pain Edu?      Excl. in [place]?    No data found.  Updated Vital Signs BP (!) 189/99 (BP Location: Left Arm)    Pulse 90    Temp (!) 101 F (38.3 C) (Oral)    Resp 18    LMP 06/10/2021 (Approximate)    SpO2 100%   Visual Acuity Right Eye Distance:   Left Eye Distance:   Bilateral Distance:    Right Eye Near:   Left Eye Near:    Bilateral Near:     Physical Exam Vitals reviewed.  Constitutional:      General: She is awake. She is not in acute distress.    Appearance: Normal appearance. She is well-developed. She is not ill-appearing.     Comments: Very pleasant female appears stated age in no acute distress sitting comfortably in exam room  HENT:     Head: Normocephalic and atraumatic.     Right Ear: Tympanic membrane, ear canal and external ear normal. Tympanic membrane is not erythematous or bulging.     Left Ear: Tympanic membrane, ear canal and external ear normal. Tympanic membrane is not erythematous or bulging.     Nose:     Right Sinus: No maxillary sinus tenderness or frontal sinus tenderness.     Left Sinus: No maxillary sinus tenderness or frontal sinus tenderness.     Mouth/Throat:     Pharynx: Uvula midline. Posterior oropharyngeal erythema present. No oropharyngeal exudate.  Cardiovascular:     Rate and Rhythm: Normal rate and regular rhythm.     Heart sounds: Normal heart sounds, S1 normal and S2 normal. No murmur heard. Pulmonary:     Effort: Pulmonary effort is normal.     Breath sounds: Normal breath sounds. No wheezing, rhonchi or rales.     Comments: Clear to auscultation bilaterally Psychiatric:        Behavior: Behavior is cooperative.     UC Treatments / Results  Labs (all labs ordered are listed, but only abnormal results are displayed) Labs Reviewed  POC INFLUENZA A AND B ANTIGEN  (URGENT CARE ONLY) - Abnormal; Notable for the following components:      Result Value   INFLUENZA A ANTIGEN, POC POSITIVE (*)    All other components within  normal limits    EKG   Radiology No results found.  Procedures Procedures (including critical care time)  Medications Ordered in UC Medications  acetaminophen (TYLENOL) tablet 975 mg (975 mg Oral Given 07/16/21 1432)    Initial Impression / Assessment and Plan / UC Course  I have reviewed the triage vital signs and the nursing notes.  Pertinent labs & imaging results that were available during my care of the patient were reviewed by me and considered in my medical decision making (see chart for details).     Patient has a positive for influenza A.  She is within 72 hours of symptom onset so we will start Tamiflu.  She was prescribed promethazine DM for cough with instruction not to drive or drink alcohol with taking this.  Encouraged her to use over-the-counter medication including Tylenol/ibuprofen for fever and pain as well as Mucinex/Flonase for congestion.  She is to rest and drink plenty of fluid.  She can return to work when she has been fever free for 24 hours without medication use.  Discussed that if she has any worsening symptoms including chest pain, shortness of breath, high fever not responding to medication, nausea/vomiting interfering with oral intake she needs to go to the emergency room.  Strict return precautions given to which she expressed understanding.  Blood pressure was elevated today.  Patient was encouraged to avoid NSAIDs, decongestants, sodium, caffeine.  Recommended she monitor her blood pressure at home and if persistently above 140/90 she needs to be reevaluated.  Discussed that if she develops any chest pain, shortness of breath, headache, vision changes, dizziness in the setting of high blood pressure she needs to go to the emergency room.  Final Clinical Impressions(s) / UC Diagnoses   Final  diagnoses:  Influenza A  Acute cough  Elevated blood pressure reading     Discharge Instructions      You tested positive for flu.  Start Tamiflu twice daily.  Use Tylenol and ibuprofen for fever and pain.  Use use Mucinex and Flonase for congestion and cough.  You can use Promethazine DM for cough but do not drive or drink alcohol taking this as it will cause drowsiness.  Make sure you rest and drink plenty of fluid.  If you have any worsening symptoms including high fevers not responding to medication, nausea/vomiting interfering with oral intake, chest pain, shortness of breath you need to be reevaluated immediately.  Your blood pressure is very elevated.  Please avoid decongestants, NSAIDs (aspirin, ibuprofen/Advil, naproxen/Aleve), caffeine, sodium.  Monitor your blood pressure at home and if this is persistently above 140/90 you need to be reevaluated.  Follow-up with either our clinic or your primary care provider within a week to ensure normalization of your blood pressure.  If you develop any chest pain, shortness of breath, headache, dizziness, vision changes in the setting of high blood pressure you need to go to the emergency room.     ED Prescriptions     Medication Sig Dispense Auth. Provider   oseltamivir (TAMIFLU) 75 MG capsule Take 1 capsule (75 mg total) by mouth every 12 (twelve) hours. 10 capsule Deloris Mittag K, PA-C   promethazine-dextromethorphan (PROMETHAZINE-DM) 6.25-15 MG/5ML syrup Take 5 mLs by mouth 3 (three) times daily as needed for cough. 118 mL Yalda Herd K, PA-C      PDMP not reviewed this encounter.   Terrilee Croak, PA-C 07/16/21 1450

## 2021-07-16 NOTE — Discharge Instructions (Signed)
You tested positive for flu.  Start Tamiflu twice daily.  Use Tylenol and ibuprofen for fever and pain.  Use use Mucinex and Flonase for congestion and cough.  You can use Promethazine DM for cough but do not drive or drink alcohol taking this as it will cause drowsiness.  Make sure you rest and drink plenty of fluid.  If you have any worsening symptoms including high fevers not responding to medication, nausea/vomiting interfering with oral intake, chest pain, shortness of breath you need to be reevaluated immediately.  Your blood pressure is very elevated.  Please avoid decongestants, NSAIDs (aspirin, ibuprofen/Advil, naproxen/Aleve), caffeine, sodium.  Monitor your blood pressure at home and if this is persistently above 140/90 you need to be reevaluated.  Follow-up with either our clinic or your primary care provider within a week to ensure normalization of your blood pressure.  If you develop any chest pain, shortness of breath, headache, dizziness, vision changes in the setting of high blood pressure you need to go to the emergency room.

## 2022-02-11 ENCOUNTER — Emergency Department (HOSPITAL_COMMUNITY): Payer: Self-pay

## 2022-02-11 ENCOUNTER — Emergency Department (HOSPITAL_COMMUNITY)
Admission: EM | Admit: 2022-02-11 | Discharge: 2022-02-11 | Disposition: A | Discharge status: Home / Self Care | Payer: Self-pay | Attending: Emergency Medicine | Admitting: Emergency Medicine

## 2022-02-11 ENCOUNTER — Other Ambulatory Visit: Payer: Self-pay

## 2022-02-11 ENCOUNTER — Encounter (HOSPITAL_COMMUNITY): Payer: Self-pay

## 2022-02-11 DIAGNOSIS — M25562 Pain in left knee: Secondary | ICD-10-CM | POA: Insufficient documentation

## 2022-02-11 DIAGNOSIS — Z79899 Other long term (current) drug therapy: Secondary | ICD-10-CM | POA: Insufficient documentation

## 2022-02-11 DIAGNOSIS — M545 Low back pain, unspecified: Secondary | ICD-10-CM | POA: Insufficient documentation

## 2022-02-11 LAB — POC URINE PREG, ED: Preg Test, Ur: NEGATIVE

## 2022-02-11 MED ORDER — DEXAMETHASONE SODIUM PHOSPHATE 10 MG/ML IJ SOLN
10.0000 mg | Freq: Once | INTRAMUSCULAR | Status: AC
  Administered 2022-02-11: 10 mg via INTRAMUSCULAR
  Filled 2022-02-11: qty 1

## 2022-02-11 MED ORDER — IBUPROFEN 200 MG PO TABS
600.0000 mg | ORAL_TABLET | Freq: Once | ORAL | Status: AC
Start: 2022-02-11 — End: 2022-02-11
  Administered 2022-02-11: 600 mg via ORAL
  Filled 2022-02-11: qty 3

## 2022-02-11 MED ORDER — MELOXICAM 15 MG PO TABS: 15.0000 mg | ORAL_TABLET | Freq: Every day | ORAL | 0 refills | Status: AC

## 2022-02-11 MED ORDER — PREDNISONE 10 MG (21) PO TBPK: ORAL_TABLET | Freq: Every day | ORAL | 0 refills | Status: DC

## 2022-02-11 NOTE — Discharge Instructions (Addendum)
You were seen today for combination of low back pain and left-sided knee pain after a fall.  No fracture was noted on any of your imaging.  Mild disc protrusion was noted at the L4-L5 level in your spine.  I prescribed a steroid to help with inflammation and swelling.  I have also prescribed an anti-inflammatory medication.  Please follow-up with the provided providers to discuss both your knee pain and your back pain.  You may ice and elevate the left knee as needed.  Please take Tylenol for any acute pain.  Return to the hospital if you develop any life-threatening conditions

## 2022-02-11 NOTE — ED Triage Notes (Signed)
Pt reports left hip pain down to left foot. Pt states fell down stairs last night, denies loc or hitting head.

## 2022-02-11 NOTE — Progress Notes (Signed)
Orthopedic Tech Progress Note Patient Details:  Carolyn Cortez 21-Oct-1985 [id number]  Ortho Devices Type of Ortho Device: Crutches, Knee Immobilizer Ortho Device/Splint Location: LLE Ortho Device/Splint Interventions: Ordered, Adjustment, Application   Post Interventions Patient Tolerated: Well Instructions Provided: Poper ambulation with device  Ashauna Bertholf E Dayquan Buys 02/11/2022, 1:40 PM

## 2022-02-11 NOTE — ED Provider Notes (Signed)
[facility]EMERGENCY DEPT Provider Note   CSN: [id number] Arrival date & time: 02/11/22  2694     History  Chief Complaint  Patient presents with   Hip Pain    Carolyn Cortez is a 36 y.o. female.  Patient presents to the hospital complaining of left-sided groin pain after a fall which occurred last evening.  The patient states she tripped and fell falling directly on the lateral pelvis.  She states she has pain in the groin which radiates down the anterior thigh towards the knee.  She currently rates her pain as 10 out of 10.  She denies any other injuries at this time.  Patient has past medical history significant for PCOS and anemia  HPI     Home Medications Prior to Admission medications   Medication Sig Start Date End Date Taking? Authorizing Provider  meloxicam (MOBIC) 15 MG tablet Take 1 tablet (15 mg total) by mouth daily. 02/11/22 03/13/22 Yes Silvestre Mines, Dorisann Frames, PA-C  predniSONE (STERAPRED UNI-PAK 21 TAB) 10 MG (21) TBPK tablet Take by mouth daily. Take 6 tabs by mouth daily  for 2 days, then 5 tabs for 2 days, then 4 tabs for 2 days, then 3 tabs for 2 days, 2 tabs for 2 days, then 1 tab by mouth daily for 2 days 02/11/22  Yes Marcelline Deist, Dorisann Frames, PA-C  fluticasone (FLONASE) 50 MCG/ACT nasal spray Place 1 spray into both nostrils daily. 01/08/17   Hedges, Tinnie Gens, PA-C  ibuprofen (ADVIL,MOTRIN) 200 MG tablet Take 400 mg by mouth every 6 (six) hours as needed for moderate pain.     [provider]  metFORMIN (GLUCOPHAGE) 500 MG tablet TAKE ONE TABLET BY MOUTH TWICE DAILY WITH A MEAL 01/25/17   Karim-Rhoades, Walidah N, CNM  oseltamivir (TAMIFLU) 75 MG capsule Take 1 capsule (75 mg total) by mouth every 12 (twelve) hours. 07/16/21   Raspet, Noberto Retort, PA-C  oxyCODONE (OXY IR/ROXICODONE) 5 MG immediate release tablet Take 1 tablet (5 mg total) by mouth every 6 (six) hours as needed for severe pain. 05/26/20   Renne Crigler, PA-C  promethazine-dextromethorphan  (PROMETHAZINE-DM) 6.25-15 MG/5ML syrup Take 5 mLs by mouth 3 (three) times daily as needed for cough. 07/16/21   Raspet, Noberto Retort, PA-C  norgestimate-ethinyl estradiol (ORTHO-CYCLEN,SPRINTEC,PREVIFEM) 0.25-35 MG-MCG tablet Take 2 tablets by mouth daily. Take 2 tabs daily until bleeding stops then 1 tab daily Patient not taking: Reported on 11/12/2017 06/16/17 05/26/20  Donette Serene Kopf, CNM      Allergies    Patient has no known allergies.    Review of Systems   Review of Systems  Musculoskeletal:  Positive for arthralgias.    Physical Exam Updated Vital Signs BP (!) 161/116 (BP Location: Left Arm)   Pulse (!) 112   Temp 98.8 F (37.1 C) (Oral)   Resp 16   Ht 5\' 2"  (1.575 m)   Wt 81.6 kg   LMP 01/18/2022   SpO2 97%   BMI 32.92 kg/m  Physical Exam Vitals and nursing note reviewed.  Constitutional:      General: She is not in acute distress. HENT:     Head: Normocephalic.  Eyes:     Conjunctiva/sclera: Conjunctivae normal.  Cardiovascular:     Rate and Rhythm: Normal rate.     Pulses: Normal pulses.  Pulmonary:     Effort: Pulmonary effort is normal.  Musculoskeletal:        General: No swelling or deformity.     Cervical back:  Normal range of motion and neck supple.     Comments: Pain in groin on left with hip flexion. Positive SLR on left side  Skin:    General: Skin is warm and dry.     Capillary Refill: Capillary refill takes less than 2 seconds.  Neurological:     Mental Status: She is alert and oriented to person, place, and time.     ED Results / Procedures / Treatments   Labs (all labs ordered are listed, but only abnormal results are displayed) Labs Reviewed  POC URINE PREG, ED    EKG None  Radiology DG Knee Complete 4 Views Left  Result Date: 02/11/2022 CLINICAL DATA:  36 year old female with pain radiating to the left hip and down the foot after fall down stairs. EXAM: LEFT KNEE - COMPLETE 4+ VIEW COMPARISON:  None Available. FINDINGS: Small  suprapatellar joint effusion visible on cross-table lateral view. Bone mineralization is within normal limits. Maintained joint spaces and alignment. Patella appears intact. No acute osseous abnormality identified. IMPRESSION: Positive for small joint effusion. No acute fracture or dislocation identified about the left knee. Electronically Signed   By: Odessa Fleming M.D.   On: 02/11/2022 12:36   CT Lumbar Spine Wo Contrast  Result Date: 02/11/2022 CLINICAL DATA:  36 year old female with pain radiating to the left hip and down the foot after fall down stairs. EXAM: CT LUMBAR SPINE WITHOUT CONTRAST TECHNIQUE: Multidetector CT imaging of the lumbar spine was performed without intravenous contrast administration. Multiplanar CT image reconstructions were also generated. RADIATION DOSE REDUCTION: This exam was performed according to the departmental dose-optimization program which includes automated exposure control, adjustment of the mA and/or kV according to patient size and/or use of iterative reconstruction technique. COMPARISON:  Left hip series 1130 hours today. FINDINGS: Segmentation: Normal. Alignment: Mild straightening of lumbar lordosis. Subtle retrolisthesis of L5 on S1. Vertebrae: No acute osseous abnormality identified. Intact visible sacrum. Bone mineralization is within normal limits. Paraspinal and other soft tissues: Negative visible noncontrast abdominal viscera. Lumbar paraspinal soft tissues are within normal limits. Disc levels: T11-T12: Negative. T12-L1:  Negative. L1-L2:  Negative. L2-L3:  Negative. L3-L4:  Negative. L4-L5: Subtle left disc bulging into the left L4 neural foramen. No convincing foraminal stenosis. No evidence of spinal or lateral recess stenosis. L5-S1:  Negative. IMPRESSION: 1. No osseous abnormality in the lumbar spine. 2. Minimal lumbar spine degeneration. Mild left foraminal disc or protrusion suspected at L4-L5. No convincing neural impingement but query Left L4 radiculitis.  Electronically Signed   By: Odessa Fleming M.D.   On: 02/11/2022 12:35   DG Hip Unilat With Pelvis 2-3 Views Left  Result Date: 02/11/2022 CLINICAL DATA:  Left-sided groin pain after fall. EXAM: DG HIP (WITH OR WITHOUT PELVIS) 2-3V LEFT COMPARISON:  None Available. FINDINGS: There is no evidence of hip fracture or dislocation. There is no evidence of arthropathy or other focal bone abnormality. IMPRESSION: Negative. Electronically Signed   By: Marin Roberts M.D.   On: 02/11/2022 11:42    Procedures Procedures    Medications Ordered in ED Medications  dexamethasone (DECADRON) injection 10 mg (has no administration in time range)  ibuprofen (ADVIL) tablet 600 mg (600 mg Oral Given 02/11/22 1051)    ED Course/ Medical Decision Making/ A&P                           Medical Decision Making Amount and/or Complexity of Data Reviewed Radiology: ordered.  Risk  OTC drugs.   Patient presents with a chief complaint of left-sided groin/hip pain.  Differential includes but is not limited to fracture, dislocation, lumbar spine etiology, soft tissue injury, and others  I ordered and reviewed lab results including a negative urine pregnancy test.  I ordered and interpreted imaging including plain films of the left hip.  No evidence of hip fracture or dislocation.  No evidence of arthropathy or focal bone abnormality.  I agree with the radiologist findings.  I ordered the patient ibuprofen for pain.  Upon reassessment the patient's pain had improved  Based on the patient's presentation I believe it is more likely that she has a lumbar pain with some radiation into the groin.  The patient has a straight leg raise which is positive on the left side.  Upon reassessment she also had some midline lumbar spine pain and left-sided knee pain.  I have ordered additional imaging and interpreted the same including plain films of the left knee and CT lumbar spine.  The imagings show a possible small joint  effusion.  No acute fracture or dislocation.  Lumbar imaging shows no osseous abnormality in the lumbar spine.  Minimal lumbar spine degeneration.  Mild left foraminal disc protrusion suspected at L4-L5.  No convincing neural impingement but query left L4 radiculitis.  I agree with the radiologist findings  I ordered the patient a Decadron injection for inflammation.  Knee brace and crutches ordered for patient's left to help reduce weightbearing.  Patient does not feel that she can bear weight due to pain at this time.  There is no indication at this time for admission.  Plan to discharge patient home with a steroid taper and meloxicam.  I will provide patient follow-up information for neurosurgery and orthopedics to discuss both her back and left knee.  Patient voices understanding.  Work note provided.  Discharge home.        Final Clinical Impression(s) / ED Diagnoses Final diagnoses:  Acute left-sided low back pain, unspecified whether sciatica present  Acute pain of left knee    Rx / DC Orders ED Discharge Orders          Ordered    predniSONE (STERAPRED UNI-PAK 21 TAB) 10 MG (21) TBPK tablet  Daily        02/11/22 1324    meloxicam (MOBIC) 15 MG tablet  Daily        02/11/22 1324              Pamala Duffel 02/11/22 1325    Terrilee Files, MD 02/11/22 1735

## 2022-07-02 ENCOUNTER — Other Ambulatory Visit: Payer: Self-pay

## 2022-07-02 ENCOUNTER — Encounter (HOSPITAL_COMMUNITY): Payer: Self-pay

## 2022-07-02 ENCOUNTER — Emergency Department (HOSPITAL_COMMUNITY)
Admission: EM | Admit: 2022-07-02 | Discharge: 2022-07-02 | Disposition: A | Discharge status: Home / Self Care | Payer: Self-pay | Attending: Emergency Medicine | Admitting: Emergency Medicine

## 2022-07-02 ENCOUNTER — Emergency Department (HOSPITAL_COMMUNITY): Payer: Self-pay

## 2022-07-02 DIAGNOSIS — I10 Essential (primary) hypertension: Secondary | ICD-10-CM | POA: Insufficient documentation

## 2022-07-02 LAB — BASIC METABOLIC PANEL
Anion gap: 7 (ref 5–15)
BUN: 13 mg/dL (ref 6–20)
CO2: 25 mmol/L (ref 22–32)
Calcium: 8.3 mg/dL — ABNORMAL LOW (ref 8.9–10.3)
Chloride: 103 mmol/L (ref 98–111)
Creatinine, Ser: 0.97 mg/dL (ref 0.44–1.00)
GFR, Estimated: >60 mL/min (ref >60)
Glucose, Bld: 83 mg/dL (ref 70–99)
Potassium: 3.4 mmol/L — ABNORMAL LOW (ref 3.5–5.1)
Sodium: 135 mmol/L (ref 135–145)

## 2022-07-02 LAB — CBC WITH DIFFERENTIAL/PLATELET
Abs Immature Granulocytes: 0.02 10*3/uL (ref 0.00–0.07)
Basophils Absolute: 0 10*3/uL (ref 0.0–0.1)
Basophils Relative: 1 %
Eosinophils Absolute: 0.2 10*3/uL (ref 0.0–0.5)
Eosinophils Relative: 3 %
HCT: 32.4 % — ABNORMAL LOW (ref 36.0–46.0)
Hemoglobin: 10.4 g/dL — ABNORMAL LOW (ref 12.0–15.0)
Immature Granulocytes: 0 %
Lymphocytes Relative: 21 %
Lymphs Abs: 1.4 10*3/uL (ref 0.7–4.0)
MCH: 21.9 pg — ABNORMAL LOW (ref 26.0–34.0)
MCHC: 32.1 g/dL (ref 30.0–36.0)
MCV: 68.2 fL — ABNORMAL LOW (ref 80.0–100.0)
Monocytes Absolute: 0.7 10*3/uL (ref 0.1–1.0)
Monocytes Relative: 11 %
Neutro Abs: 4.3 10*3/uL (ref 1.7–7.7)
Neutrophils Relative %: 64 %
Platelets: 295 10*3/uL (ref 150–400)
RBC: 4.75 MIL/uL (ref 3.87–5.11)
RDW: 19.7 % — ABNORMAL HIGH (ref 11.5–15.5)
WBC: 6.6 10*3/uL (ref 4.0–10.5)
nRBC: 0 % (ref 0.0–0.2)

## 2022-07-02 LAB — HCG, SERUM, QUALITATIVE: Preg, Serum: NEGATIVE

## 2022-07-02 MED ORDER — AMLODIPINE BESYLATE 5 MG PO TABS: 5.0000 mg | ORAL_TABLET | Freq: Every day | ORAL | 1 refills | Status: DC

## 2022-07-02 NOTE — ED Provider Triage Note (Signed)
Emergency Medicine Provider Triage Evaluation Note  RAFAELLA KOLE , a 37 y.o. female  was evaluated in triage.  Pt complains of hypertension.  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  Denies any chest pain or shortness of breath.  Endorses slight headache and nausea, no vomiting.  Not on any blood pressure medicine..  Review of Systems  Per HPI  Physical Exam  BP (!) 212/137 (BP Location: Left Arm)   Pulse 87   Temp 98.6 F (37 C) (Oral)   Resp 18   SpO2 95%  Gen:   Awake, no distress   Resp:  Normal effort  MSK:   Moves extremities without difficulty  Other:    Medical Decision Making  Medically screening exam initiated at 9:06 AM.  Appropriate orders placed.  Beatriz Chancellor was informed that the remainder of the evaluation will be completed by another provider, this initial triage assessment does not replace that evaluation, and the importance of remaining in the ED until their evaluation is complete.     Sherrill Raring, [place] 07/02/22 [phone]

## 2022-07-02 NOTE — ED Provider Notes (Signed)
[place] DEPT Provider Note   CSN: [id number] Arrival date & time: 07/02/22  0865     History  Chief Complaint  Patient presents with   Hypertension    Carolyn Cortez is a 37 y.o. female.  37 year old female with prior medical history as detailed below presents for evaluation.  Patient reports longstanding history of hypertension.  Patient reports that she had previously been on antihypertensives.  She reports that she has been off her antihypertensives for at least 6 months.  This morning she reports that she was not feeling well.  She denies chest pain or shortness of breath.  She endorsed a mild headache.  At work, she checked her blood pressure and noticed that it was elevated to the 784O systolic.  She does not routinely check her blood pressures at home.  The patient is followed by PCP at [facility].  She cannot recall what antihypertensive she was on previously.  At time of exam, she is comfortable.  She denies significant headache at this time.  She denies chest pain.  She denies other complaint.  The history is provided by the patient and medical records.       Home Medications Prior to Admission medications   Medication Sig Start Date End Date Taking? Authorizing Provider  fluticasone (FLONASE) 50 MCG/ACT nasal spray Place 1 spray into both nostrils daily. 01/08/17   Hedges, Dellis Filbert, PA-C  ibuprofen (ADVIL,MOTRIN) 200 MG tablet Take 400 mg by mouth every 6 (six) hours as needed for moderate pain.     [provider]  metFORMIN (GLUCOPHAGE) 500 MG tablet TAKE ONE TABLET BY MOUTH TWICE DAILY WITH A MEAL 01/25/17   Karim-Rhoades, Walidah N, CNM  oseltamivir (TAMIFLU) 75 MG capsule Take 1 capsule (75 mg total) by mouth every 12 (twelve) hours. 07/16/21   Raspet, Derry Skill, PA-C  oxyCODONE (OXY IR/ROXICODONE) 5 MG immediate release tablet Take 1 tablet (5 mg total) by mouth every 6 (six) hours as needed for severe pain. 05/26/20    Carlisle Cater, PA-C  predniSONE (STERAPRED UNI-PAK 21 TAB) 10 MG (21) TBPK tablet Take by mouth daily. Take 6 tabs by mouth daily  for 2 days, then 5 tabs for 2 days, then 4 tabs for 2 days, then 3 tabs for 2 days, 2 tabs for 2 days, then 1 tab by mouth daily for 2 days 02/11/22   Dorothyann Peng, PA-C  promethazine-dextromethorphan (PROMETHAZINE-DM) 6.25-15 MG/5ML syrup Take 5 mLs by mouth 3 (three) times daily as needed for cough. 07/16/21   Raspet, Derry Skill, PA-C  norgestimate-ethinyl estradiol (ORTHO-CYCLEN,SPRINTEC,PREVIFEM) 0.25-35 MG-MCG tablet Take 2 tablets by mouth daily. Take 2 tabs daily until bleeding stops then 1 tab daily Patient not taking: Reported on 11/12/2017 06/16/17 05/26/20  Julianne Handler, CNM      Allergies    Patient has no known allergies.    Review of Systems   Review of Systems  Physical Exam Updated Vital Signs BP (!) 206/121   Pulse 79   Temp 98.6 F (37 C) (Oral)   Resp 20   SpO2 100%  Physical Exam Vitals and nursing note reviewed.  Constitutional:      General: She is not in acute distress.    Appearance: Normal appearance. She is well-developed.  HENT:     Head: Normocephalic and atraumatic.  Eyes:     Conjunctiva/sclera: Conjunctivae normal.     Pupils: Pupils are equal, round, and reactive to light.  Cardiovascular:  Rate and Rhythm: Normal rate and regular rhythm.     Heart sounds: Normal heart sounds.  Pulmonary:     Effort: Pulmonary effort is normal. No respiratory distress.     Breath sounds: Normal breath sounds.  Abdominal:     General: There is no distension.     Palpations: Abdomen is soft.     Tenderness: There is no abdominal tenderness.  Musculoskeletal:        General: No deformity. Normal range of motion.     Cervical back: Normal range of motion and neck supple.  Skin:    General: Skin is warm and dry.  Neurological:     General: No focal deficit present.     Mental Status: She is alert and oriented to person,  place, and time. Mental status is at baseline.     Cranial Nerves: No cranial nerve deficit.     Sensory: No sensory deficit.     Motor: No weakness.     Coordination: Coordination normal.     ED Results / Procedures / Treatments   Labs (all labs ordered are listed, but only abnormal results are displayed) Labs Reviewed  BASIC METABOLIC PANEL - Abnormal; Notable for the following components:      Result Value   Potassium 3.4 (*)    Calcium 8.3 (*)    All other components within normal limits  CBC WITH DIFFERENTIAL/PLATELET - Abnormal; Notable for the following components:   Hemoglobin 10.4 (*)    HCT 32.4 (*)    MCV 68.2 (*)    MCH 21.9 (*)    RDW 19.7 (*)    All other components within normal limits  HCG, SERUM, QUALITATIVE    EKG EKG Interpretation  Date/Time:  Monday July 02 2022 08:57:29 EST Ventricular Rate:  87 PR Interval:  120 QRS Duration: 80 QT Interval:  392 QTC Calculation: 471 R Axis:   -44 Text Interpretation: Normal sinus rhythm Left axis deviation Abnormal ECG When compared with ECG of 25-Oct-2014 11:42, PREVIOUS ECG IS PRESENT Confirmed by Dene Gentry [phone]) on 07/02/2022 9:13:18 AM  Radiology CT Head Wo Contrast  Result Date: 07/02/2022 CLINICAL DATA:  38 year old female with sudden severe headache. Hypertension. EXAM: CT HEAD WITHOUT CONTRAST TECHNIQUE: Contiguous axial images were obtained from the base of the skull through the vertex without intravenous contrast. RADIATION DOSE REDUCTION: This exam was performed according to the departmental dose-optimization program which includes automated exposure control, adjustment of the mA and/or kV according to patient size and/or use of iterative reconstruction technique. COMPARISON:  Head CT 05/26/2020. FINDINGS: Brain: Cerebral volume remains normal. No midline shift, ventriculomegaly, mass effect, evidence of mass lesion, intracranial hemorrhage or evidence of cortically based acute infarction. Gray-white  matter differentiation is within normal limits throughout the brain. Vascular: No suspicious intracranial vascular hyperdensity. Skull: Negative. Sinuses/Orbits: Regressed sphenoid sinus mucosal thickening since 2021. Elsewhere visualized paranasal sinuses and mastoids are stable and well aerated. Other: Visualized orbits and scalp soft tissues are within normal limits. IMPRESSION: Stable and normal noncontrast CT appearance of the brain. Electronically Signed   By: Genevie Ann M.D.   On: 07/02/2022 09:45    Procedures Procedures    Medications Ordered in ED Medications - No data to display  ED Course/ Medical Decision Making/ A&P                           Medical Decision Making Amount and/or Complexity of Data Reviewed Radiology:  ordered.    Medical Screen Complete  This patient presented to the ED with complaint of hypertension.  This complaint involves an extensive number of treatment options. The initial differential diagnosis includes, but is not limited to, endorgan damage secondary to hypertension  This presentation is: Acute, Chronic, Self-Limited, Previously Undiagnosed, Uncertain Prognosis, Complicated, Systemic Symptoms, and Threat to Life/Bodily Function  Patient with longstanding history of hypertension who is not currently on medications for same presents after checking her blood pressure.  She is notably hypertensive with systolic of 200 over approximately 125.  This appears to be chronic.  She is without evidence of endorgan damage on workup.  Patient does have established PCP with [facility].  Patient is advised to closely follow-up with [facility] medical tomorrow for continued workup and evaluation of her already established hypertension.  Patient will be provided with a prescription for antihypertensive that she is advised to start.   Importance of close follow-up is repeatedly stressed.  Strict return precautions given and understood.  Additional history  obtained:  Additional history obtained from [facility] External records from outside sources obtained and reviewed including prior ED visits and prior Inpatient records.    Lab Tests:  I ordered and personally interpreted labs.  The pertinent results include: CBC, BMP, ECG   Imaging Studies ordered:  I ordered imaging studies including CT head I independently visualized and interpreted obtained imaging which showed NAD I agree with the radiologist interpretation.   Cardiac Monitoring:  The patient was maintained on a cardiac monitor.  I personally viewed and interpreted the cardiac monitor which showed an underlying rhythm of: NSR  Problem List / ED Course:  Hypertension   Reevaluation:  After the interventions noted above, I reevaluated the patient and found that they have: improved   Disposition:  After consideration of the diagnostic results and the patients response to treatment, I feel that the patent would benefit from close outpatient follow-up.          Final Clinical Impression(s) / ED Diagnoses Final diagnoses:  Hypertension, unspecified type    Rx / DC Orders ED Discharge Orders          Ordered    amLODipine (NORVASC) 5 MG tablet  Daily        07/02/22 1118              Wynetta Fines, MD 07/02/22 1119

## 2022-07-02 NOTE — ED Triage Notes (Signed)
Pt c/o headache, hypertensive. Pt denies SOB, denies chest pain. Pt states her headache and slight dizziness started this morning. Pt states she has hx of HTN, pt states she has not been taking her prescribed BP meds, she states she ran out more than a month ago.

## 2022-07-02 NOTE — Discharge Instructions (Signed)
Return for any problem.  ?

## 2022-08-23 LAB — HM PAP SMEAR
HM Pap smear: NEGATIVE
HPV 16/18/45 genotyping: NEGATIVE
HPV, high-risk: POSITIVE

## 2023-01-15 ENCOUNTER — Telehealth: Payer: Self-pay | Admitting: Physician Assistant

## 2023-01-15 ENCOUNTER — Encounter: Payer: Self-pay | Admitting: Physician Assistant

## 2023-01-15 ENCOUNTER — Ambulatory Visit (INDEPENDENT_AMBULATORY_CARE_PROVIDER_SITE_OTHER): Payer: PRIVATE HEALTH INSURANCE | Admitting: Physician Assistant

## 2023-01-15 VITALS — BP 160/100 | HR 56 | Temp 97.7°F | Ht 62.0 in | Wt 192.2 lb

## 2023-01-15 DIAGNOSIS — E282 Polycystic ovarian syndrome: Secondary | ICD-10-CM | POA: Diagnosis not present

## 2023-01-15 DIAGNOSIS — Z8742 Personal history of other diseases of the female genital tract: Secondary | ICD-10-CM | POA: Diagnosis not present

## 2023-01-15 DIAGNOSIS — Z903 Acquired absence of stomach [part of]: Secondary | ICD-10-CM | POA: Insufficient documentation

## 2023-01-15 DIAGNOSIS — I10 Essential (primary) hypertension: Secondary | ICD-10-CM

## 2023-01-15 DIAGNOSIS — E669 Obesity, unspecified: Secondary | ICD-10-CM | POA: Insufficient documentation

## 2023-01-15 MED ORDER — LOSARTAN POTASSIUM 50 MG PO TABS: 50.0000 mg | ORAL_TABLET | Freq: Every day | ORAL | 3 refills | Status: DC

## 2023-01-15 MED ORDER — AMLODIPINE BESYLATE 10 MG PO TABS: 10.0000 mg | ORAL_TABLET | Freq: Every day | ORAL | 3 refills | Status: DC

## 2023-01-15 NOTE — Telephone Encounter (Signed)
Error

## 2023-01-15 NOTE — Progress Notes (Signed)
Carolyn Cortez is a 37 y.o. female here for a new problem.  History of Present Illness:   Chief Complaint  Patient presents with   Establish Care   Hypertension    Pt needs refills on her blood pressure medications.    HPI  HTN Her blood pressure is elevated during this visit.  She has not taken any blood pressure medication the past 2 weeks.  She is requesting a refill for 10 mg amlodipine and 50 mg losartan.  She started taking medication to control her blood pressure 2 years ago.  BP Readings from Last 3 Encounters:  01/15/23 (!) 160/100  07/02/22 (!) 206/121  02/11/22 (!) 198/122    PCOS:  She was diagnosed with PCOS during high school age due to irregular menstrual cycles.  She is not taking birth control medication. She is seeing a GYN specialist at this time and is requesting to switch to another office and provider.  She has a history of abnormal pap smear.  She is due for a pap smear next year.    Gastric sleeve:  She had a gastric sleeve procedure in 2018.  She is not taking vitamins regularly at this time.  She is not taking her bariatric vitamins prescribed by her other provider due to developing headaches while taking them.  She is interested in checking her vitamin levels during this visit.  She has a history of anemia and has taken iron supplements to manage her levels.  She notes having constipation while taking iron supplements.   Social history: She is not drinking alcohol at this time.   Family history:  She has no family medical history of cancer.     Past Medical History:  Diagnosis Date   Abnormal Pap smear 06/22/2010   Anemia    Chlamydia    Hypertension    Morbid obesity (HCC)    PCOS (polycystic ovarian syndrome)    Vaginal bleeding, abnormal    Vaginal Pap smear, abnormal      Social History   Tobacco Use   Smoking status: Some Days    Types: Cigars   Smokeless tobacco: Never  Vaping Use   Vaping status: Never Used   Substance Use Topics   Alcohol use: Not Currently   Drug use: Never    Past Surgical History:  Procedure Laterality Date   BREAST REDUCTION SURGERY Bilateral 2021   COLPOSCOPY  07/28/2010   LAPAROSCOPIC GASTRIC SLEEVE RESECTION  2018    History reviewed. No pertinent family history.  No Known Allergies  Current Medications:   Current Outpatient Medications:    amLODipine (NORVASC) 10 MG tablet, Take 1 tablet (10 mg total) by mouth daily., Disp: 90 tablet, Rfl: 3   losartan (COZAAR) 50 MG tablet, Take 1 tablet (50 mg total) by mouth daily., Disp: 90 tablet, Rfl: 3   Review of Systems:   ROS Negative unless otherwise specified per HPI.  Vitals:   Vitals:   01/15/23 1433  BP: (!) 160/100  Pulse: (!) 56  Temp: 97.7 F (36.5 C)  TempSrc: Temporal  SpO2: 98%  Weight: 192 lb 4 oz (87.2 kg)  Height: 5\' 2"  (1.575 m)     Body mass index is 35.16 kg/m.  Physical Exam:   Physical Exam Vitals and nursing note reviewed.  Constitutional:      General: She is not in acute distress.    Appearance: She is well-developed. She is not ill-appearing or toxic-appearing.  Cardiovascular:     Rate  and Rhythm: Normal rate and regular rhythm.     Pulses: Normal pulses.     Heart sounds: Normal heart sounds, S1 normal and S2 normal.  Pulmonary:     Effort: Pulmonary effort is normal.     Breath sounds: Normal breath sounds.  Skin:    General: Skin is warm and dry.  Neurological:     Mental Status: She is alert.     GCS: GCS eye subscore is 4. GCS verbal subscore is 5. GCS motor subscore is 6.  Psychiatric:        Speech: Speech normal.        Behavior: Behavior normal. Behavior is cooperative.     Assessment and Plan:   PCOS (polycystic ovarian syndrome); History of abnormal cervical Pap smear Reviewed chart Will place referral to new gynecologist per her request Declines any need for oral contraceptive pill(s) at this time  Primary hypertension Uncontrolled due to  lack of medication Above goal today No evidence of end-organ damage on my exam Recommend patient monitor home blood pressure at least a few times weekly Restart amlodipine 10 mg daily and losartan 50 mg daily If home monitoring shows consistent elevation, or any symptom(s) develop, recommend reach out to [place] for further advice on next steps Follow-up in 1 month  H/O gastric sleeve Update blood work today Encouraged regular intake of bariatric vitamins Further recommendations based on blood work  [employer] as a Neurosurgeon for [employer], PA.,have documented all relevant documentation on the behalf of Jarold Motto, PA,as directed by  Jarold Motto, PA while in the presence of Jarold Motto, [name].  I, Jarold Motto, [name], have reviewed all documentation for this visit. The documentation on 01/15/23 for the exam, diagnosis, procedures, and orders are all accurate and complete.  Jarold Motto, PA-C

## 2023-01-15 NOTE — Patient Instructions (Addendum)
It was great to see you!  We will update blood work today Please restart your amlodipine 10 mg daily and losartan 25 mg daily  Please document your blood pressure on a log for [place] and we will review at next visit  Let's follow-up in 4-6 weeks to review your blood pressure and labs -- if blood pressure is normal we can do your surgical clearance at that time, sooner if you have concerns.    Take care,  Jarold Motto PA-C

## 2023-01-16 ENCOUNTER — Other Ambulatory Visit: Payer: Self-pay | Admitting: Physician Assistant

## 2023-01-16 LAB — CBC WITH DIFFERENTIAL/PLATELET
Basophils Absolute: 0.1 10*3/uL (ref 0.0–0.1)
Basophils Relative: 1.9 % (ref 0.0–3.0)
Eosinophils Absolute: 0.2 10*3/uL (ref 0.0–0.7)
Eosinophils Relative: 2.2 % (ref 0.0–5.0)
HCT: 33.6 % — ABNORMAL LOW (ref 36.0–46.0)
Hemoglobin: 10.4 g/dL — ABNORMAL LOW (ref 12.0–15.0)
Lymphocytes Relative: 26.8 % (ref 12.0–46.0)
Lymphs Abs: 1.8 10*3/uL (ref 0.7–4.0)
MCHC: 31.1 g/dL (ref 30.0–36.0)
MCV: 67.4 fl — ABNORMAL LOW (ref 78.0–100.0)
Monocytes Absolute: 0.7 10*3/uL (ref 0.1–1.0)
Monocytes Relative: 9.7 % (ref 3.0–12.0)
Neutro Abs: 4 10*3/uL (ref 1.4–7.7)
Neutrophils Relative %: 59.4 % (ref 43.0–77.0)
Platelets: 419 10*3/uL — ABNORMAL HIGH (ref 150.0–400.0)
RBC: 4.99 Mil/uL (ref 3.87–5.11)
RDW: 19.5 % — ABNORMAL HIGH (ref 11.5–15.5)
WBC: 6.7 10*3/uL (ref 4.0–10.5)

## 2023-01-16 LAB — LIPID PANEL
Cholesterol: 203 mg/dL — ABNORMAL HIGH (ref 0–200)
HDL: 59 mg/dL (ref >39.00)
LDL Cholesterol: 131 mg/dL — ABNORMAL HIGH (ref 0–99)
NonHDL: 143.98
Total CHOL/HDL Ratio: 3
Triglycerides: 67 mg/dL (ref 0.0–149.0)
VLDL: 13.4 mg/dL (ref 0.0–40.0)

## 2023-01-16 LAB — COMPREHENSIVE METABOLIC PANEL
ALT: 10 U/L (ref 0–35)
AST: 16 U/L (ref 0–37)
Albumin: 4.3 g/dL (ref 3.5–5.2)
Alkaline Phosphatase: 76 U/L (ref 39–117)
BUN: 13 mg/dL (ref 6–23)
CO2: 27 mEq/L (ref 19–32)
Calcium: 9.5 mg/dL (ref 8.4–10.5)
Chloride: 103 mEq/L (ref 96–112)
Creatinine, Ser: 0.92 mg/dL (ref 0.40–1.20)
GFR: 80.87 mL/min (ref >60.00)
Glucose, Bld: 79 mg/dL (ref 70–99)
Potassium: 3.9 mEq/L (ref 3.5–5.1)
Sodium: 138 mEq/L (ref 135–145)
Total Bilirubin: 0.5 mg/dL (ref 0.2–1.2)
Total Protein: 7.4 g/dL (ref 6.0–8.3)

## 2023-01-16 LAB — IBC + FERRITIN
Ferritin: 5.2 ng/mL — ABNORMAL LOW (ref 10.0–291.0)
Iron: 35 ug/dL — ABNORMAL LOW (ref 42–145)
Saturation Ratios: 6.8 % — ABNORMAL LOW (ref 20.0–50.0)
TIBC: 512.4 ug/dL — ABNORMAL HIGH (ref 250.0–450.0)
Transferrin: 366 mg/dL — ABNORMAL HIGH (ref 212.0–360.0)

## 2023-01-16 LAB — VITAMIN D 25 HYDROXY (VIT D DEFICIENCY, FRACTURES): VITD: 18.44 ng/mL — ABNORMAL LOW (ref 30.00–100.00)

## 2023-01-16 LAB — HEMOGLOBIN A1C: Hgb A1c MFr Bld: 5.6 % (ref 4.6–6.5)

## 2023-01-16 LAB — VITAMIN B12: Vitamin B-12: 865 pg/mL (ref 211–911)

## 2023-01-16 MED ORDER — VITAMIN D (ERGOCALCIFEROL) 1.25 MG (50000 UNIT) PO CAPS: 50000.0000 [IU] | ORAL_CAPSULE | ORAL | 0 refills | Status: DC

## 2023-02-12 ENCOUNTER — Ambulatory Visit: Payer: PRIVATE HEALTH INSURANCE | Admitting: Physician Assistant

## 2023-02-20 ENCOUNTER — Ambulatory Visit: Payer: PRIVATE HEALTH INSURANCE | Admitting: Physician Assistant

## 2023-03-15 ENCOUNTER — Encounter: Payer: Self-pay | Admitting: Physician Assistant

## 2023-03-15 ENCOUNTER — Ambulatory Visit (INDEPENDENT_AMBULATORY_CARE_PROVIDER_SITE_OTHER): Payer: No Typology Code available for payment source | Admitting: Physician Assistant

## 2023-03-15 VITALS — BP 146/88 | HR 76 | Temp 97.8°F | Ht 62.0 in | Wt 199.2 lb

## 2023-03-15 DIAGNOSIS — D500 Iron deficiency anemia secondary to blood loss (chronic): Secondary | ICD-10-CM | POA: Diagnosis not present

## 2023-03-15 DIAGNOSIS — I10 Essential (primary) hypertension: Secondary | ICD-10-CM

## 2023-03-15 DIAGNOSIS — J0110 Acute frontal sinusitis, unspecified: Secondary | ICD-10-CM | POA: Diagnosis not present

## 2023-03-15 MED ORDER — LOSARTAN POTASSIUM 100 MG PO TABS: 100.0000 mg | ORAL_TABLET | Freq: Every day | ORAL | 1 refills | Status: DC

## 2023-03-15 MED ORDER — AMOXICILLIN-POT CLAVULANATE 875-125 MG PO TABS: 1.0000 | ORAL_TABLET | Freq: Two times a day (BID) | ORAL | 0 refills | Status: DC

## 2023-03-15 NOTE — Progress Notes (Signed)
Carolyn Cortez is a 37 y.o. female here for a follow up of a pre-existing problem.  History of Present Illness:   Chief Complaint  Patient presents with   f/u elevated blood pressure    Pt here for f/u on blood pressure, checking once a week, last checked Sat it was 143/98.    Sinus Problem    Pt c/o sinus pressure and cough since came back from Vacation on Monday. Denies fever or chills. COVID Negative yesterday    HPI  Hypertension Patient regularly checks her blood pressure at home once a week. She last checked it on Saturday, 9/7, which was 143/98. Patient is complaint with daily 10 mg norvasc and daily 50 mg losartan. She admits that she has an excessive salt intake.  Sinus infection Patient is complaining of sinus issues starting 4 days ago. She states that she is experiencing an achy chest, clogged nose, and coughing. She reports that she has been around her mother who has a cold. Took a Covid test yesterday and came back negative. Manages sxs with mucinex with no relief. Confirms recent travel. Denies sore throat, fever, and chills.  Iron deficiency anemia She is unable to find a multivitamin that she can tolerate -- her bariatric vitamin gives her headache(s), the flinstone vitamins give her a bad taste in her mouth She is hoping to have breast augmentation in Jan and needs hemoglobin >13 to have this surgery Having irregular and sometimes heavy periods  Past Medical History:  Diagnosis Date   Abnormal Pap smear 06/22/2010   Anemia    Chlamydia    Hypertension    Morbid obesity (HCC)    PCOS (polycystic ovarian syndrome)    Vaginal bleeding, abnormal    Vaginal Pap smear, abnormal      Social History   Tobacco Use   Smoking status: Some Days    Types: Cigars   Smokeless tobacco: Never  Vaping Use   Vaping status: Never Used  Substance Use Topics   Alcohol use: Not Currently   Drug use: Never    Past Surgical History:  Procedure Laterality Date   BREAST  REDUCTION SURGERY Bilateral 2021   COLPOSCOPY  07/28/2010   LAPAROSCOPIC GASTRIC SLEEVE RESECTION  2018    No family history on file.  No Known Allergies  Current Medications:   Current Outpatient Medications:    amLODipine (NORVASC) 10 MG tablet, Take 1 tablet (10 mg total) by mouth daily., Disp: 90 tablet, Rfl: 3   amoxicillin-clavulanate (AUGMENTIN) 875-125 MG tablet, Take 1 tablet by mouth 2 (two) times daily., Disp: 14 tablet, Rfl: 0   losartan (COZAAR) 100 MG tablet, Take 1 tablet (100 mg total) by mouth daily., Disp: 90 tablet, Rfl: 1   Review of Systems:   ROS Negative unless otherwise specified per HPI.  Vitals:   Vitals:   03/15/23 1333 03/15/23 1355  BP: (!) 150/90 (!) 146/88  Pulse: 76   Temp: 97.8 F (36.6 C)   TempSrc: Temporal   Weight: 199 lb 4 oz (90.4 kg)   Height: 5\' 2"  (1.575 m)      Body mass index is 36.44 kg/m.  Physical Exam:   Physical Exam Constitutional:      General: She is not in acute distress.    Appearance: Normal appearance. She is not ill-appearing.  HENT:     Head: Normocephalic and atraumatic.     Right Ear: External ear normal.     Left Ear: External ear normal.  Eyes:     Extraocular Movements: Extraocular movements intact.     Pupils: Pupils are equal, round, and reactive to light.  Cardiovascular:     Rate and Rhythm: Normal rate and regular rhythm.     Heart sounds: Normal heart sounds. No murmur heard.    No gallop.  Pulmonary:     Effort: Pulmonary effort is normal. No respiratory distress.     Breath sounds: Normal breath sounds. No wheezing or rales.  Skin:    General: Skin is warm and dry.  Neurological:     Mental Status: She is alert and oriented to person, place, and time.  Psychiatric:        Judgment: Judgment normal.     Assessment and Plan:   Iron deficiency anemia due to chronic blood loss She is unable to tolerate oral iron supplements Recommend consideration of slow-release iron tablets Will  refer to hematology for further mgmt  Primary hypertension Above goal today No evidence of end-organ damage on my exam Recommend patient monitor home blood pressure at least a few times weekly Continue amlodipine 10 mg daily and increase losartan to 100 mg daily If home monitoring shows consistent elevation, or any symptom(s) develop, recommend reach out to [place] for further advice on next steps  Acute frontal sinusitis, recurrence not specified No red flags on exam.  Will initiate augmentin per orders. Discussed taking medications as prescribed. Reviewed return precautions including worsening fever, SOB, worsening cough or other concerns. Push fluids and rest. I recommend that patient follow-up if symptoms worsen or persist despite treatment x 7-10 days, sooner if needed.   I,Verona Buck,acting as a Neurosurgeon for [employer], PA.,have documented all relevant documentation on the behalf of Jarold Motto, PA,as directed by  Jarold Motto, PA while in the presence of Jarold Motto, [name].  I, Jarold Motto, [name], have reviewed all documentation for this visit. The documentation on 03/15/23 for the exam, diagnosis, procedures, and orders are all accurate and complete.  Jarold Motto, PA-C

## 2023-03-15 NOTE — Patient Instructions (Signed)
It was great to see you!  Continue amlodipine 10 mg Increase losartan to 100 mg daily  Work on trying to get more iron in -- look into the Slow Release Iron or the recommendations on the handout  I will send in an antibiotic for your sinus infection  I will go ahead and place referral for iron discussion with hematology  Let's follow-up in 1 month, sooner if you have concerns.  Take care,  Jarold Motto PA-C

## 2023-04-19 ENCOUNTER — Inpatient Hospital Stay: Payer: No Typology Code available for payment source

## 2023-04-19 ENCOUNTER — Inpatient Hospital Stay: Payer: No Typology Code available for payment source | Attending: Hematology | Admitting: Hematology

## 2023-04-26 ENCOUNTER — Ambulatory Visit (INDEPENDENT_AMBULATORY_CARE_PROVIDER_SITE_OTHER): Payer: No Typology Code available for payment source | Admitting: Physician Assistant

## 2023-04-26 ENCOUNTER — Encounter: Payer: Self-pay | Admitting: Physician Assistant

## 2023-04-26 VITALS — BP 130/86 | HR 79 | Temp 97.8°F | Ht 62.0 in | Wt 198.0 lb

## 2023-04-26 DIAGNOSIS — D500 Iron deficiency anemia secondary to blood loss (chronic): Secondary | ICD-10-CM | POA: Diagnosis not present

## 2023-04-26 DIAGNOSIS — I10 Essential (primary) hypertension: Secondary | ICD-10-CM | POA: Diagnosis not present

## 2023-04-26 MED ORDER — LOSARTAN POTASSIUM 100 MG PO TABS: 100.0000 mg | ORAL_TABLET | Freq: Every day | ORAL | 1 refills | Status: DC

## 2023-04-26 MED ORDER — LOSARTAN POTASSIUM-HCTZ 100-12.5 MG PO TABS: 1.0000 | ORAL_TABLET | Freq: Every day | ORAL | 1 refills | Status: DC

## 2023-04-26 NOTE — Patient Instructions (Signed)
It was great to see you!  Continue norvasc 10 mg Stop losartan 100 mg daily and START losartan-hydrochloroTHIAZIDE 100-12.5 mg daily  Take miralax and iron every other day  Let's follow-up in 1 month, sooner if you have concerns.  Take care,  Jarold Motto PA-C

## 2023-04-26 NOTE — Progress Notes (Signed)
Carolyn Cortez is a 37 y.o. female here for a follow-up of a pre-existing problem. History of Present Illness:   Chief Complaint  Patient presents with   Hypertension    Pt has been checking Bp at home 140's/80.   HPI  Hypertension: Managed/Compliant with 10 mg Amlodipine and 100 mg Losartan (increased from 50 mg on 9/13). Tolerating increased dosage.  States she takes Losartan in the mornings - discussed Losartan-Hydrochlorothiazide alternative. Monitored at home with readings of 130-140s/80-90s. Reports that she has been eating healthier recently, and that last month she started working out.  Denies excessive caffeine intake, stimulant usage, excessive alcohol intake, or increase in salt consumption, recent stress, or hx of sleep apnea.  Denies chest pain, shortness of breath, blurred vision, dizziness, unusual headaches, or lower leg swelling.  BP Readings from Last 3 Encounters:  04/26/23 130/86  03/15/23 (!) 146/88  01/15/23 (!) 160/100   Iron Deficiency Anemia: Reports that she tried slow release iron supplements but still experiences constipation.  Notes she takes supplements daily, but is willing to use Miralax to manage - discussed every other day regimen.   Past Medical History:  Diagnosis Date   Abnormal Pap smear 06/22/2010   Anemia    Chlamydia    Hypertension    Morbid obesity (HCC)    PCOS (polycystic ovarian syndrome)    Vaginal bleeding, abnormal    Vaginal Pap smear, abnormal     Social History   Tobacco Use   Smoking status: Some Days    Types: Cigars   Smokeless tobacco: Never  Vaping Use   Vaping status: Never Used  Substance Use Topics   Alcohol use: Not Currently   Drug use: Never   Past Surgical History:  Procedure Laterality Date   BREAST REDUCTION SURGERY Bilateral 2021   COLPOSCOPY  07/28/2010   LAPAROSCOPIC GASTRIC SLEEVE RESECTION  2018   No family history on file. No Known Allergies Current Medications:   Current Outpatient  Medications:    amLODipine (NORVASC) 10 MG tablet, Take 1 tablet (10 mg total) by mouth daily., Disp: 90 tablet, Rfl: 3   losartan (COZAAR) 100 MG tablet, Take 1 tablet (100 mg total) by mouth daily., Disp: 90 tablet, Rfl: 1  Review of Systems:   ROS See pertinent positives and negatives as per the HPI.  Vitals:   Vitals:   04/26/23 1301  BP: 130/86  Pulse: 79  Temp: 97.8 F (36.6 C)  TempSrc: Temporal  SpO2: 99%  Weight: 198 lb (89.8 kg)  Height: 5\' 2"  (1.575 m)     Body mass index is 36.21 kg/m.  Physical Exam:   Physical Exam Vitals and nursing note reviewed.  Constitutional:      General: She is not in acute distress.    Appearance: She is well-developed. She is not ill-appearing or toxic-appearing.  Cardiovascular:     Rate and Rhythm: Normal rate and regular rhythm.     Pulses: Normal pulses.     Heart sounds: Normal heart sounds, S1 normal and S2 normal.  Pulmonary:     Effort: Pulmonary effort is normal.     Breath sounds: Normal breath sounds.  Skin:    General: Skin is warm and dry.  Neurological:     Mental Status: She is alert.     GCS: GCS eye subscore is 4. GCS verbal subscore is 5. GCS motor subscore is 6.  Psychiatric:        Speech: Speech normal.  Behavior: Behavior normal. Behavior is cooperative.     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  I, Carolyn Cortez, Carolyn Cortez, have reviewed all documentation for this visit. The documentation on 04/26/23 for the exam, diagnosis, procedures, and orders are all accurate and complete.  Carolyn Motto, PA-C

## 2023-05-08 NOTE — Progress Notes (Deleted)
[facility] CONSULT NOTE  Patient Care Team: Jarold Motto, [name] as PCP - General (Physician Assistant)  ASSESSMENT & PLAN:  @AGE  female with history of *** being seen for iron deficiency anemia.  No problem-specific Assessment & Plan notes found for this encounter.  No orders of the defined types were placed in this encounter.   Relevant history: History of gastric bypass Last colonoscopy: Last EGD: Periods:   The mechanism of IDA is due to either blood loss or decreased absorptive mechanism or both. We discussed some of the risks, benefits, and alternatives of intravenous iron infusions. The patient is symptomatic from anemia and the iron level is critically low. She tolerated oral iron supplement poorly and desires to achieved higher levels of iron faster for adequate hematopoesis. Some of the side-effects to be expected including risks of infusion reactions, phlebitis, headaches, nausea and fatigue.  The patient is willing to proceed. Patient education material was dispensed.   IV iron *** ordered. CBC, Iron, TIBC, ferritin, iron saturation in 3-4 months Referral for colonoscopy and EGD  All questions were answered. The patient knows to call the clinic with any problems, questions or concerns.  Melven Sartorius, MD 11/6/20249:57 AM   CHIEF COMPLAINTS/PURPOSE OF CONSULTATION:  Anemia  HISTORY OF PRESENTING ILLNESS:  Carolyn Cortez 37 y.o. female is here because of anemia.  Carolyn Cortez had not noticed any recent bleeding such as epistaxis, hematuria or hematochezia ***The patient denies over the counter NSAID ingestion. She is not *** on antiplatelets agents. Her last colonoscopy was *** ***She had no prior history or diagnosis of cancer. Her age appropriate screening programs are up-to-date. ***She denies any pica and eats a variety of diet. ***She never donated blood or received blood transfusion ***The patient was prescribed oral iron supplements and she  takes ***  MEDICAL HISTORY:  Past Medical History:  Diagnosis Date   Abnormal Pap smear 06/22/2010   Anemia    Chlamydia    Hypertension    Morbid obesity (HCC)    PCOS (polycystic ovarian syndrome)    Vaginal bleeding, abnormal    Vaginal Pap smear, abnormal     SURGICAL HISTORY: Past Surgical History:  Procedure Laterality Date   BREAST REDUCTION SURGERY Bilateral 2021   COLPOSCOPY  07/28/2010   LAPAROSCOPIC GASTRIC SLEEVE RESECTION  2018    SOCIAL HISTORY: Social History   Socioeconomic History   Marital status: Single    Spouse name: Not on file   Number of children: Not on file   Years of education: Not on file   Highest education level: Not on file  Occupational History   Not on file  Tobacco Use   Smoking status: Some Days    Types: Cigars   Smokeless tobacco: Never  Vaping Use   Vaping status: Never Used  Substance and Sexual Activity   Alcohol use: Not Currently   Drug use: Never   Sexual activity: Yes    Birth control/protection: None  Other Topics Concern   Not on file  Social History Narrative   Lives with mom   Single   Cellulitis and abscess    Social Determinants of Health   Financial Resource Strain: Not on file  Food Insecurity: Not on file  Transportation Needs: Not on file  Physical Activity: Not on file  Stress: Not on file  Social Connections: Not on file  Intimate Partner Violence: Not on file    FAMILY HISTORY: No family history on file.  ALLERGIES:  has No Known Allergies.  MEDICATIONS:  Current Outpatient Medications  Medication Sig Dispense Refill   amLODipine (NORVASC) 10 MG tablet Take 1 tablet (10 mg total) by mouth daily. 90 tablet 3   losartan-hydrochlorothiazide (HYZAAR) 100-12.5 MG tablet Take 1 tablet by mouth daily. 90 tablet 1   No current facility-administered medications for this visit.    REVIEW OF SYSTEMS:   Respiratory: Denies shortness of breath Cardiovascular: Denies chest pain, chest  discomfort Gastrointestinal:  Denies nausea, abdominal pain, melena or bloody stools GU: no hematuria Skin: skin color change All other systems were reviewed with the patient and are negative.  PHYSICAL EXAMINATION: ECOG PERFORMANCE STATUS: {CHL ONC ECOG [phone]}  There were no vitals filed for this visit. There were no vitals filed for this visit.  GENERAL: alert, no distress and comfortable SKIN: skin color normal EYES: normal conjunctiva, sclera clear LUNGS: normal breathing effort HEART: regular rate & rhythm ABDOMEN: abdomen soft, non-tender and nondistended  RADIOGRAPHIC STUDIES: I have personally reviewed the radiological images as listed and agreed with the findings in the report. No results found.

## 2023-05-09 ENCOUNTER — Inpatient Hospital Stay: Payer: No Typology Code available for payment source

## 2023-05-09 ENCOUNTER — Inpatient Hospital Stay: Payer: No Typology Code available for payment source | Attending: Hematology

## 2023-05-28 ENCOUNTER — Ambulatory Visit: Payer: No Typology Code available for payment source | Admitting: Physician Assistant

## 2023-07-10 ENCOUNTER — Encounter: Payer: Self-pay | Admitting: Obstetrics and Gynecology

## 2023-08-01 ENCOUNTER — Ambulatory Visit: Payer: No Typology Code available for payment source | Admitting: Physician Assistant

## 2023-08-29 ENCOUNTER — Ambulatory Visit (INDEPENDENT_AMBULATORY_CARE_PROVIDER_SITE_OTHER): Payer: No Typology Code available for payment source | Admitting: Physician Assistant

## 2023-08-29 ENCOUNTER — Encounter: Payer: Self-pay | Admitting: Physician Assistant

## 2023-08-29 ENCOUNTER — Other Ambulatory Visit (HOSPITAL_COMMUNITY)
Admission: RE | Admit: 2023-08-29 | Discharge: 2023-08-29 | Disposition: A | Discharge status: Home / Self Care | Payer: No Typology Code available for payment source | Source: Ambulatory Visit | Attending: Physician Assistant | Admitting: Physician Assistant

## 2023-08-29 VITALS — BP 146/90 | HR 87 | Temp 98.1°F | Ht 62.0 in | Wt 204.5 lb

## 2023-08-29 DIAGNOSIS — N921 Excessive and frequent menstruation with irregular cycle: Secondary | ICD-10-CM

## 2023-08-29 DIAGNOSIS — I10 Essential (primary) hypertension: Secondary | ICD-10-CM | POA: Diagnosis not present

## 2023-08-29 DIAGNOSIS — R051 Acute cough: Secondary | ICD-10-CM | POA: Diagnosis not present

## 2023-08-29 LAB — POCT URINE PREGNANCY: Preg Test, Ur: NEGATIVE

## 2023-08-29 MED ORDER — LOSARTAN POTASSIUM-HCTZ 100-12.5 MG PO TABS: 1.0000 | ORAL_TABLET | Freq: Every day | ORAL | 0 refills | Status: DC

## 2023-08-29 MED ORDER — AMOXICILLIN-POT CLAVULANATE 875-125 MG PO TABS: 1.0000 | ORAL_TABLET | Freq: Two times a day (BID) | ORAL | 0 refills | Status: DC

## 2023-08-29 MED ORDER — AMLODIPINE BESYLATE 10 MG PO TABS: 10.0000 mg | ORAL_TABLET | Freq: Every day | ORAL | 0 refills | Status: DC

## 2023-08-29 NOTE — Patient Instructions (Signed)
 It was great to see you!  Start Augmentin for your sinus infection Please avoid NSAIDs such as ibuprofen given your history of gastric surgery  I will be in touch with vaginal swab results and the plan  Restart amlodipine, continue losartan-hydrochloroTHIAZIDE Your blood pressure is elevated in our office today. I recommend that you monitor this at home.  Your goal blood pressure should be around < 130/80, unless you are over 36 years old, your goal may be closer to 140-150/90. Please note if you have been given other goals from a cardiologist or other healthcare provider, please defer to their recommendations.  When preparing to take your blood pressure: Plan ahead. Don't smoke, drink caffeine or exercise within 30 minutes before taking your blood pressure. Empty your bladder. Don't take the measurement over clothes. Remove the clothing over the arm that will be used to measure blood pressure. You can use either arm unless otherwise told by a healthcare provider. Usually there is not a big difference between readings on them. Be still. Allow at least five minutes of quiet rest before measurements. Don't talk or use the phone. Sit correctly. Sit with your back straight and supported (on a dining chair, rather than a sofa). Your feet should be flat on the floor. Do not cross your legs. Support your arm on a flat surface. The middle of the cuff should be placed on the upper arm at heart level.  Measure at the same time of the day. Take multiple readings and record the results. Each time you measure, take two readings one minute apart. Record the results and bring in to your next office visit.  In order to know how well the medication is working, I would like you to take your readings 1-2 hours after taking your blood pressure medication if possible. Take your blood pressure measurements and record 2-3 days per week.  If you get a high blood pressure reading: A single high reading is not an  immediate cause for alarm. If you get a reading that is higher than normal, take your blood pressure a second time. Write down the results of both measurements. Check with your health care professional to see if there's a health concern or whether there may be problems with your monitor. If your blood pressure readings are suddenly higher than 180/120 mm Hg, wait at least one minute and test again. If your readings are still very high, contact your health care professional immediately. You could be having a hypertensive crisis. Call 911 if your blood pressure is higher than 180/120 mm Hg and if you are having new signs or symptoms that may include: Chest pain Shortness of breath Back pain Numbness Weakness Change in vision Difficulty speaking Confusion Dizziness Vomiting  Take care,  Jarold Motto PA-C

## 2023-08-29 NOTE — Progress Notes (Signed)
 Carolyn Cortez is a 38 y.o. female here for a new problem.  History of Present Illness:   Chief Complaint  Patient presents with   Vaginal Bleeding    Pt c/o vaginal bleeding off and on, started on Saturday, started out as spotting and now heavy bleeding, some cramps. Has an appt with GYN next Friday 3/7.   Cough    Pt c/o cough and nasal congestion started on Sunday, went to UC on Monday, Flu & COVID Negative.    Vaginal Bleeding  Patient complains today of on/off vaginal bleeding that started this past Saturday.  Bleeding started as light spotting but has recently become very heavy bleeding. She is following up with her OBY next Friday.  Patient also endorse's mild abdominal pain and occasional irregular bleeding due to PCOS.  She's not sure of any chance of pregnancy. reports taking ibuprofen to help relieve symptoms.   Cough  Patient also complains of a cough along with nasal congestion that started this past Sunday.  Associated symptoms include sore throat, fatigue, and hoarseness.   Flu, strep and Covid tests done at [facility] on Monday were negative. Symptoms are worsening since her visit to UC.  She has only taken Mucinex this morning, no other OTC medications.  Denies any fevers or chills.    HTN  Reports compliance and good tolerance of Amlodipine 10 mg and Hyzaar 100-12.5 mg daily.  However she as recently ran out of amlodipine and is requesting refills.  BP has been running high the past few days but she attributes that to being sick.  No concerns or symptoms at this time. Patient denies chest pain, SOB, blurred vision, dizziness, unusual headaches, lower leg swelling. Patient is compliant with medication. Denies excessive caffeine intake, stimulant usage, excessive alcohol intake, or increase in salt   Past Medical History:  Diagnosis Date   Abnormal Pap smear 06/22/2010   Anemia    Chlamydia    Hypertension    Morbid obesity (HCC)    PCOS (polycystic ovarian  syndrome)    Vaginal bleeding, abnormal    Vaginal Pap smear, abnormal      Social History   Tobacco Use   Smoking status: Former    Types: Cigars    Quit date: 07/03/2023    Years since quitting: 0.1   Smokeless tobacco: Never  Vaping Use   Vaping status: Never Used  Substance Use Topics   Alcohol use: Not Currently   Drug use: Never    Past Surgical History:  Procedure Laterality Date   BREAST REDUCTION SURGERY Bilateral 2021   COLPOSCOPY  07/28/2010   LAPAROSCOPIC GASTRIC SLEEVE RESECTION  2018    No family history on file.  No Known Allergies  Current Medications:   Current Outpatient Medications:    amoxicillin-clavulanate (AUGMENTIN) 875-125 MG tablet, Take 1 tablet by mouth 2 (two) times daily., Disp: 14 tablet, Rfl: 0   amLODipine (NORVASC) 10 MG tablet, Take 1 tablet (10 mg total) by mouth daily., Disp: 90 tablet, Rfl: 0   losartan-hydrochlorothiazide (HYZAAR) 100-12.5 MG tablet, Take 1 tablet by mouth daily., Disp: 90 tablet, Rfl: 0   Review of Systems:   Review of Systems  Constitutional:  Positive for malaise/fatigue. Negative for chills and fever.  HENT:  Positive for congestion and sore throat.   Respiratory:  Positive for cough.   Gastrointestinal:  Positive for abdominal pain.       +vaginal bleeding  Genitourinary:  Positive for vaginal bleeding.  Vitals:   Vitals:   08/29/23 0837 08/29/23 0915  BP: (!) 146/100 (!) 146/90  Pulse: 87   Temp: 98.1 F (36.7 C)   TempSrc: Temporal   SpO2: 98%   Weight: 204 lb 8 oz (92.8 kg)   Height: 5\' 2"  (1.575 m)      Body mass index is 37.4 kg/m.  Physical Exam:   Physical Exam Vitals and nursing note reviewed. Exam conducted with a chaperone present.  Constitutional:      General: She is not in acute distress.    Appearance: She is well-developed. She is not ill-appearing or toxic-appearing.  HENT:     Head: Normocephalic and atraumatic.     Right Ear: Tympanic membrane, ear canal and  external ear normal. Tympanic membrane is not erythematous, retracted or bulging.     Left Ear: Tympanic membrane, ear canal and external ear normal. Tympanic membrane is not erythematous, retracted or bulging.     Nose:     Right Sinus: Frontal sinus tenderness present. No maxillary sinus tenderness.     Left Sinus: Frontal sinus tenderness present. No maxillary sinus tenderness.     Mouth/Throat:     Pharynx: Uvula midline. No posterior oropharyngeal erythema.  Eyes:     General: Lids are normal.     Conjunctiva/sclera: Conjunctivae normal.  Neck:     Trachea: Trachea normal.  Cardiovascular:     Rate and Rhythm: Normal rate and regular rhythm.     Heart sounds: Normal heart sounds, S1 normal and S2 normal.  Pulmonary:     Effort: Pulmonary effort is normal.     Breath sounds: Normal breath sounds. No decreased breath sounds, wheezing, rhonchi or rales.  Genitourinary:    Vagina: Vaginal discharge and tenderness present.     Cervix: Normal.  Lymphadenopathy:     Cervical: Cervical adenopathy present.     Right cervical: Superficial cervical adenopathy present.     Left cervical: Superficial cervical adenopathy present.  Skin:    General: Skin is warm and dry.  Neurological:     Mental Status: She is alert.  Psychiatric:        Speech: Speech normal.        Behavior: Behavior normal. Behavior is cooperative.    Results for orders placed or performed in visit on 08/29/23  POCT urine pregnancy  Result Value Ref Range   Preg Test, Ur Negative Negative     Assessment and Plan:   Primary hypertension Above goal today No evidence of end-organ damage on my exam Recommend patient monitor home blood pressure at least a few times weekly Resume Amlodipine 10 mg and continue Hyzaar 100-12.5 mg daily.  If home monitoring shows consistent elevation, or any symptom(s) develop, recommend reach out to [place] for further advice on next steps Follow up in 3-6 month(s), sooner if  concerns  Metrorrhagia Unclear etiology No red flags on exam Does have history of pcos Urine pregnancy test negative Vaginal swab obtained and will treat accordingly Has close follow up with gynecology soon  Acute cough No red flags on exam.   Will initiate augmentin per orders.  Discussed taking medications as prescribed.  Reviewed return precautions including new or worsening fever, SOB, new or worsening cough or other concerns.  Push fluids and rest.  I recommend that patient follow-up if symptoms worsen or persist despite treatment x 7-10 days, sooner if needed.      Jarold Motto, PA-C  I,Safa M Kadhim,acting as a scribe for  Jarold Motto, PA.,have documented all relevant documentation on the behalf of Jarold Motto, PA,as directed by  Jarold Motto, PA while in the presence of Jarold Motto, [name].   I, Jarold Motto, [name], have reviewed all documentation for this visit. The documentation on 08/29/23 for the exam, diagnosis, procedures, and orders are all accurate and complete.

## 2023-08-30 ENCOUNTER — Encounter: Payer: Self-pay | Admitting: Physician Assistant

## 2023-08-30 LAB — CERVICOVAGINAL ANCILLARY ONLY
Bacterial Vaginitis (gardnerella): NEGATIVE
Candida Glabrata: NEGATIVE
Candida Vaginitis: NEGATIVE
Chlamydia: NEGATIVE
Comment: NEGATIVE
Comment: NEGATIVE
Comment: NEGATIVE
Comment: NEGATIVE
Comment: NEGATIVE
Comment: NORMAL
Neisseria Gonorrhea: NEGATIVE
Trichomonas: NEGATIVE

## 2023-09-25 ENCOUNTER — Other Ambulatory Visit: Payer: Self-pay | Admitting: Physician Assistant

## 2023-09-25 MED ORDER — LOSARTAN POTASSIUM-HCTZ 100-12.5 MG PO TABS: 1.0000 | ORAL_TABLET | Freq: Every day | ORAL | 0 refills | Status: DC

## 2024-01-09 ENCOUNTER — Ambulatory Visit: Payer: Self-pay | Admitting: Physician Assistant

## 2024-01-31 ENCOUNTER — Ambulatory Visit: Payer: Self-pay | Admitting: Physician Assistant

## 2024-02-13 ENCOUNTER — Ambulatory Visit: Admitting: Physician Assistant

## 2024-02-19 ENCOUNTER — Ambulatory Visit: Admitting: Physician Assistant

## 2024-02-19 ENCOUNTER — Encounter: Payer: Self-pay | Admitting: Physician Assistant

## 2024-02-19 VITALS — BP 150/96 | HR 66 | Temp 97.5°F | Ht 62.0 in | Wt 211.4 lb

## 2024-02-19 DIAGNOSIS — E282 Polycystic ovarian syndrome: Secondary | ICD-10-CM

## 2024-02-19 DIAGNOSIS — E559 Vitamin D deficiency, unspecified: Secondary | ICD-10-CM

## 2024-02-19 DIAGNOSIS — Z01818 Encounter for other preprocedural examination: Secondary | ICD-10-CM | POA: Diagnosis not present

## 2024-02-19 DIAGNOSIS — I10 Essential (primary) hypertension: Secondary | ICD-10-CM

## 2024-02-19 DIAGNOSIS — Z903 Acquired absence of stomach [part of]: Secondary | ICD-10-CM

## 2024-02-19 DIAGNOSIS — D500 Iron deficiency anemia secondary to blood loss (chronic): Secondary | ICD-10-CM

## 2024-02-19 LAB — CBC WITH DIFFERENTIAL/PLATELET
Basophils Absolute: 0 K/uL (ref 0.0–0.1)
Basophils Relative: 0.6 % (ref 0.0–3.0)
Eosinophils Absolute: 0.2 K/uL (ref 0.0–0.7)
Eosinophils Relative: 2.5 % (ref 0.0–5.0)
HCT: 30.8 % — ABNORMAL LOW (ref 36.0–46.0)
Hemoglobin: 9.5 g/dL — ABNORMAL LOW (ref 12.0–15.0)
Lymphocytes Relative: 20.6 % (ref 12.0–46.0)
Lymphs Abs: 1.3 K/uL (ref 0.7–4.0)
MCHC: 30.8 g/dL (ref 30.0–36.0)
MCV: 63.4 fl — ABNORMAL LOW (ref 78.0–100.0)
Monocytes Absolute: 0.7 K/uL (ref 0.1–1.0)
Monocytes Relative: 11 % (ref 3.0–12.0)
Neutro Abs: 4.2 K/uL (ref 1.4–7.7)
Neutrophils Relative %: 65.3 % (ref 43.0–77.0)
Platelets: 390 K/uL (ref 150.0–400.0)
RBC: 4.85 Mil/uL (ref 3.87–5.11)
RDW: 22 % — ABNORMAL HIGH (ref 11.5–15.5)
WBC: 6.5 K/uL (ref 4.0–10.5)

## 2024-02-19 LAB — VITAMIN D 25 HYDROXY (VIT D DEFICIENCY, FRACTURES): VITD: 19.53 ng/mL — ABNORMAL LOW (ref 30.00–100.00)

## 2024-02-19 LAB — COMPREHENSIVE METABOLIC PANEL WITH GFR
ALT: 9 U/L (ref 0–35)
AST: 15 U/L (ref 0–37)
Albumin: 4.1 g/dL (ref 3.5–5.2)
Alkaline Phosphatase: 66 U/L (ref 39–117)
BUN: 12 mg/dL (ref 6–23)
CO2: 29 meq/L (ref 19–32)
Calcium: 8.8 mg/dL (ref 8.4–10.5)
Chloride: 99 meq/L (ref 96–112)
Creatinine, Ser: 0.96 mg/dL (ref 0.40–1.20)
GFR: 76.26 mL/min (ref >60.00)
Glucose, Bld: 89 mg/dL (ref 70–99)
Potassium: 3.4 meq/L — ABNORMAL LOW (ref 3.5–5.1)
Sodium: 135 meq/L (ref 135–145)
Total Bilirubin: 0.5 mg/dL (ref 0.2–1.2)
Total Protein: 7.3 g/dL (ref 6.0–8.3)

## 2024-02-19 LAB — IBC + FERRITIN
Ferritin: 6 ng/mL — ABNORMAL LOW (ref 10.0–291.0)
Iron: 35 ug/dL — ABNORMAL LOW (ref 42–145)
Saturation Ratios: 6.6 % — ABNORMAL LOW (ref 20.0–50.0)
TIBC: 532 ug/dL — ABNORMAL HIGH (ref 250.0–450.0)
Transferrin: 380 mg/dL — ABNORMAL HIGH (ref 212.0–360.0)

## 2024-02-19 LAB — LIPID PANEL
Cholesterol: 172 mg/dL (ref 0–200)
HDL: 49.9 mg/dL (ref >39.00)
LDL Cholesterol: 108 mg/dL — ABNORMAL HIGH (ref 0–99)
NonHDL: 121.76
Total CHOL/HDL Ratio: 3
Triglycerides: 67 mg/dL (ref 0.0–149.0)
VLDL: 13.4 mg/dL (ref 0.0–40.0)

## 2024-02-19 LAB — VITAMIN B12: Vitamin B-12: 438 pg/mL (ref 211–911)

## 2024-02-19 LAB — HEMOGLOBIN A1C: Hgb A1c MFr Bld: 5.8 % (ref 4.6–6.5)

## 2024-02-19 LAB — TSH: TSH: 2.86 u[IU]/mL (ref 0.35–5.50)

## 2024-02-19 MED ORDER — LOSARTAN POTASSIUM-HCTZ 100-25 MG PO TABS
1.0000 | ORAL_TABLET | Freq: Every day | ORAL | 3 refills | Status: DC
Start: 2024-02-19 — End: 2024-05-21

## 2024-02-19 MED ORDER — WEGOVY 0.25 MG/0.5ML ~~LOC~~ SOAJ
0.2500 mg | SUBCUTANEOUS | 1 refills | Status: AC
Start: 2024-02-19 — End: ?

## 2024-02-19 NOTE — Patient Instructions (Signed)
 It was great to see you!  I will send in [employer]  to see if insurance covers this  Your blood pressure is elevated in our office today.  I recommend that you monitor this at home.  Your goal blood pressure should be around < 130/80, unless you are over 36 years old, your goal may be closer to 140-150/90. Please note if you have been given other goals from a cardiologist or other healthcare provider, please defer to their recommendations.  When preparing to take your blood pressure: Plan ahead. Don't smoke, drink caffeine or exercise within 30 minutes before taking your blood pressure. Empty your bladder. Don't take the measurement over clothes. Remove the clothing over the arm that will be used to measure blood pressure. You can use either arm unless otherwise told by a healthcare provider. Usually there is not a big difference between readings on them. Be still. Allow at least five minutes of quiet rest before measurements. Don't talk or use the phone. Sit correctly. Sit with your back straight and supported (on a dining chair, rather than a sofa). Your feet should be flat on the floor. Do not cross your legs. Support your arm on a flat surface. The middle of the cuff should be placed on the upper arm at heart level.  Measure at the same time of the day. Take multiple readings and record the results. Each time you measure, take two readings one minute apart. Record the results and bring in to your next office visit.  In order to know how well the medication is working, I would like you to take your readings 1-2 hours after taking your blood pressure medication if possible. Take your blood pressure measurements and record 2-3 days per week.  If you get a high blood pressure reading: A single high reading is not an immediate cause for alarm. If you get a reading that is higher than normal, take your blood pressure a second time. Write down the results of both measurements. Check with your health  care professional to see if there's a health concern or whether there may be problems with your monitor. If your blood pressure readings are suddenly higher than 180/120 mm Hg, wait at least one minute and test again. If your readings are still very high, contact your health care professional immediately. You could be having a hypertensive crisis. Call 911 if your blood pressure is higher than 180/120 mm Hg and if you are having new signs or symptoms that may include: Chest pain Shortness of breath Back pain Numbness Weakness Change in vision Difficulty speaking Confusion Dizziness Vomiting     Take care,  Lucie Buttner PA-C

## 2024-02-19 NOTE — Progress Notes (Signed)
 Carolyn Cortez is a 38 y.o. female here for a follow up of a pre-existing problem.  History of Present Illness:   Chief Complaint  Patient presents with   Weight Gain    Pt c/o increase in weight for the past year.   Fatigue    Pt c/o feeling tired past 2 months.    Discussed the use of AI scribe software for clinical note transcription with the patient, who gave verbal consent to proceed.  History of Present Illness   Carolyn Cortez is a 38 year old female with hypertension who presents for evaluation of uncontrolled blood pressure, weight management and preoperative evaluation. She is considering a revision to her gastric bypass.  She is on amlodipine  10 mg, losartan  100 mg, and hydrochlorothiazide 12.5 mg for hypertension. Her blood pressure remains elevated at 146/90 mmHg. She does not consistently take her medications daily and does not monitor her blood pressure at home. Reports there is no chest pain, shortness of breath.   She underwent gastric bypass surgery in 2018 however she is concerned about recent weight gain and is considering restarting injectable weight loss medications with her new insurance. She is exercising regularly and reducing her calorie intake as able.  She experiences fatigue and suspects it may be related to not taking her bariatric supplements regularly.    Past Medical History:  Diagnosis Date   Abnormal Pap smear 06/22/2010   Anemia    Chlamydia    Hypertension    Morbid obesity (HCC)    PCOS (polycystic ovarian syndrome)    Vaginal bleeding, abnormal    Vaginal Pap smear, abnormal      Social History   Tobacco Use   Smoking status: Former    Types: Cigars    Quit date: 07/03/2023    Years since quitting: 0.6   Smokeless tobacco: Never  Vaping Use   Vaping status: Never Used  Substance Use Topics   Alcohol use: Not Currently   Drug use: Never    Past Surgical History:  Procedure Laterality Date   BREAST REDUCTION SURGERY Bilateral  2021   COLPOSCOPY  07/28/2010   LAPAROSCOPIC GASTRIC SLEEVE RESECTION  2018    No family history on file.  No Known Allergies  Current Medications:   Current Outpatient Medications:    amLODipine  (NORVASC ) 10 MG tablet, Take 1 tablet (10 mg total) by mouth daily., Disp: 90 tablet, Rfl: 0   losartan -hydrochlorothiazide (HYZAAR) 100-25 MG tablet, Take 1 tablet by mouth daily., Disp: 90 tablet, Rfl: 3   semaglutide -weight management (WEGOVY ) 0.25 MG/0.5ML SOAJ SQ injection, Inject 0.25 mg into the skin once a week., Disp: 2 mL, Rfl: 1   Review of Systems:   Negative unless otherwise specified per HPI.  Vitals:   Vitals:   02/19/24 0857  BP: (!) 150/96  Pulse: 66  Temp: (!) 97.5 F (36.4 C)  TempSrc: Temporal  SpO2: 98%  Weight: 211 lb 6.1 oz (95.9 kg)  Height: 5' 2 (1.575 m)     Body mass index is 38.66 kg/m.  Physical Exam:   Physical Exam Vitals and nursing note reviewed.  Constitutional:      General: She is not in acute distress.    Appearance: She is well-developed. She is not ill-appearing or toxic-appearing.  Cardiovascular:     Rate and Rhythm: Normal rate and regular rhythm.     Pulses: Normal pulses.     Heart sounds: Normal heart sounds, S1 normal and S2 normal.  Pulmonary:     Effort: Pulmonary effort is normal.     Breath sounds: Normal breath sounds.  Skin:    General: Skin is warm and dry.  Neurological:     Mental Status: She is alert.     GCS: GCS eye subscore is 4. GCS verbal subscore is 5. GCS motor subscore is 6.  Psychiatric:        Speech: Speech normal.        Behavior: Behavior normal. Behavior is cooperative.     Assessment and Plan:   Assessment and Plan    Hypertension, uncontrolled Hypertension remains uncontrolled despite current medication regimen. Non-adherence may contribute. Insurance requires documentation for surgical clearance. - Increase hydrochlorothiazide to 25 mg daily. - Continue amlodipine  10 mg daily and  losartan  100 mg daily. - Instruct to take all antihypertensive medications daily. - Perform EKG today. EKG tracing is personally reviewed.  EKG notes NSR.  No acute changes.  - Write letter for insurance stating current medication use and uncontrolled blood pressure. - Order blood work to assess overall health and potential hypertension causes.  Obesity, post-bariatric surgery Weight gain post-gastric sleeve surgery. Injectable medications for weight loss may be covered by new insurance, offering a non-surgical option. Considering revision surgery. - Attempt to restart weight loss injectables through new insurance. - Schedule follow-up in one month to assess progress and discuss further options.  Fatigue, evaluation for nutritional deficiency Persistent fatigue possibly related to nutritional deficiencies post-bariatric surgery. Inconsistent supplement intake noted. - Order comprehensive blood work to evaluate for nutritional deficiencies. - Instruct to resume bariatric supplements as prescribed.  - Discussed need to get back on hypertension medication.        Lucie Buttner, PA-C

## 2024-02-20 ENCOUNTER — Ambulatory Visit: Payer: Self-pay | Admitting: Physician Assistant

## 2024-02-20 MED ORDER — VITAMIN D (ERGOCALCIFEROL) 1.25 MG (50000 UNIT) PO CAPS: 50000.0000 [IU] | ORAL_CAPSULE | ORAL | 0 refills | Status: AC

## 2024-02-24 ENCOUNTER — Telehealth: Payer: Self-pay | Admitting: *Deleted

## 2024-02-24 ENCOUNTER — Telehealth: Payer: Self-pay

## 2024-02-24 ENCOUNTER — Other Ambulatory Visit (HOSPITAL_COMMUNITY): Payer: Self-pay

## 2024-02-24 NOTE — Telephone Encounter (Signed)
 Pharmacy Patient Advocate Encounter   Received notification from Pt Calls Messages that prior authorization for WEGOVY  0.25MG /0.5ML auto-injectors is required/requested.   Insurance verification completed.   The patient is insured through [employer] .   Per test claim: PA required; PA submitted to above mentioned insurance via Latent Key/confirmation #/[id number] [facility] Status is pending

## 2024-02-24 NOTE — Telephone Encounter (Signed)
 Please do PA for Wegovy 0.25 mg

## 2024-03-03 NOTE — Telephone Encounter (Signed)
 Pharmacy Patient Advocate Encounter  Received notification from [employer] that Prior Authorization for WEGOVY  AUTO-INJECTORS has been DENIED.  Full denial letter will be uploaded to the media tab. See denial reason below.    PA #/Case ID/Reference #: [id number]

## 2024-03-09 ENCOUNTER — Ambulatory Visit: Admitting: Family

## 2024-03-09 ENCOUNTER — Ambulatory Visit: Payer: Self-pay

## 2024-03-09 NOTE — Telephone Encounter (Signed)
 FYI Only or Action Required?: FYI only for provider.  Patient was last seen in primary care on 02/19/2024 by Job Lukes, PA.  Called Nurse Triage reporting Nasal Congestion, Generalized Body Aches, Cough, and Fatigue.  Symptoms began several days ago.  Interventions attempted: Rest, hydration, or home remedies.  Symptoms are: gradually worsening.  Triage Disposition: See Physician Within 24 Hours  Patient/caregiver understands and will follow disposition?: Yes      Copied from CRM [id number]. Topic: Clinical - Red Word Triage >> Mar 09, 2024  8:27 AM Charlet HERO wrote: Red Word that prompted transfer to Nurse Triage: Patient is calling her nose is running , body hurts and she has a cough. Reason for Disposition  [1] Continuous (nonstop) coughing interferes with work or school AND [2] no improvement using cough treatment per Care Advice  Answer Assessment - Initial Assessment Questions 1. ONSET: When did the cough begin?      Night before last 2. SEVERITY: How bad is the cough today?      Getting worse/productive 3. SPUTUM: Describe the color of your sputum (e.g., none, dry cough; clear, white, yellow, green)     Phlegm not spit out 4. HEMOPTYSIS: Are you coughing up any blood? If Yes, ask: How much? (e.g., flecks, streaks, tablespoons, etc.)     no 5. DIFFICULTY BREATHING: Are you having difficulty breathing? If Yes, ask: How bad is it? (e.g., mild, moderate, severe)      denies 6. FEVER: Do you have a fever? If Yes, ask: What is your temperature, how was it measured, and when did it start?     no 7. CARDIAC HISTORY: Do you have any history of heart disease? (e.g., heart attack, congestive heart failure)      HTN 8. LUNG HISTORY: Do you have any history of lung disease?  (e.g., pulmonary embolus, asthma, emphysema)     no 9. PE RISK FACTORS: Do you have a history of blood clots? (or: recent major surgery, recent prolonged travel, bedridden)      denies 10. OTHER SYMPTOMS: Do you have any other symptoms? (e.g., runny nose, wheezing, chest pain)       No chest pain, vomiting Body aches Runny nose Little bit diarrhea x1 last night Really tired, not too weak to stand 11. PREGNANCY: Is there any chance you are pregnant? When was your last menstrual period?       No   Advised appt in next 24 hours, scheduled with PCP office, advised call back if worsening.  Protocols used: Cough - Acute Non-Productive-A-AH

## 2024-03-23 ENCOUNTER — Telehealth: Payer: Self-pay | Admitting: Physician Assistant

## 2024-03-23 ENCOUNTER — Ambulatory Visit: Admitting: Physician Assistant

## 2024-03-23 NOTE — Telephone Encounter (Signed)
Carolyn Cortez aware. °

## 2024-03-23 NOTE — Telephone Encounter (Signed)
 Patient states she overslept for appointment today (9/22) with Lucie Buttner.  States she will have to call back to reschedule.   Patient has had two same day cancellations within the year:  03/19/24, 01/09/24  Patient has had five no shows within the year:  01/31/24, 08/01/23, 07/20/23, 05/28/23, 02/20/23  I have advised patient of now show policy and have informed her that she can be discharged from the practice with another same dya cancellation or now show.    Patient understood.

## 2024-03-27 ENCOUNTER — Encounter: Payer: Self-pay | Admitting: Physician Assistant

## 2024-04-09 ENCOUNTER — Other Ambulatory Visit (HOSPITAL_COMMUNITY): Payer: Self-pay

## 2024-05-21 ENCOUNTER — Other Ambulatory Visit: Payer: Self-pay | Admitting: Physician Assistant

## 2024-05-21 MED ORDER — LOSARTAN POTASSIUM-HCTZ 100-25 MG PO TABS: 1.0000 | ORAL_TABLET | Freq: Every day | ORAL | 3 refills | Status: AC

## 2024-05-21 MED ORDER — AMLODIPINE BESYLATE 10 MG PO TABS: 10.0000 mg | ORAL_TABLET | Freq: Every day | ORAL | 0 refills | Status: AC

## 2024-05-21 NOTE — Telephone Encounter (Signed)
 Copied from CRM #[id number]. Topic: Clinical - Medication Refill >> May 21, 2024 11:09 AM Viola F wrote: Medication: amLODipine  (NORVASC ) 10 MG tablet [[id number]] losartan -hydrochlorothiazide (HYZAAR) 100-25 MG tablet [[id number]]  Has the patient contacted their pharmacy? Yes (Agent: If no, request that the patient contact the pharmacy for the refill. If patient does not wish to contact the pharmacy document the reason why and proceed with request.) (Agent: If yes, when and what did the pharmacy advise?)  This is the patient's preferred pharmacy:  [id number] - [place] ([place]), [place] - [address] [place] ([place]) [place] [id number] Phone: [phone] Fax: [phone]    Is this the correct pharmacy for this prescription? Yes If no, delete pharmacy and type the correct one.   Has the prescription been filled recently? Yes  Is the patient out of the medication? No  Has the patient been seen for an appointment in the last year OR does the patient have an upcoming appointment? Yes  Can we respond through MyChart? Yes  Agent: Please be advised that Rx refills may take up to 3 business days. We ask that you follow-up with your pharmacy.
